# Patient Record
Sex: Male | Born: 1960
Health system: Southern US, Community
[De-identification: ages and names within clinical notes are randomized; demographics above are authoritative.]

## PROBLEM LIST (undated history)

## (undated) DIAGNOSIS — I2510 Atherosclerotic heart disease of native coronary artery without angina pectoris: Secondary | ICD-10-CM

## (undated) DIAGNOSIS — I10 Essential (primary) hypertension: Secondary | ICD-10-CM

## (undated) DIAGNOSIS — R7303 Prediabetes: Secondary | ICD-10-CM

## (undated) DIAGNOSIS — I509 Heart failure, unspecified: Secondary | ICD-10-CM

---

## 2000-04-07 ENCOUNTER — Emergency Department (HOSPITAL_COMMUNITY): Admission: EM | Admit: 2000-04-07 | Discharge: 2000-04-07 | Payer: Self-pay | Admitting: Emergency Medicine

## 2010-09-08 ENCOUNTER — Emergency Department (HOSPITAL_COMMUNITY)
Admission: EM | Admit: 2010-09-08 | Discharge: 2010-09-08 | Disposition: A | Discharge status: Home / Self Care | Payer: Self-pay | Attending: Emergency Medicine | Admitting: Emergency Medicine

## 2010-09-08 ENCOUNTER — Emergency Department (HOSPITAL_COMMUNITY): Payer: Self-pay

## 2010-09-08 DIAGNOSIS — Y92410 Unspecified street and highway as the place of occurrence of the external cause: Secondary | ICD-10-CM | POA: Insufficient documentation

## 2010-09-08 DIAGNOSIS — S139XXA Sprain of joints and ligaments of unspecified parts of neck, initial encounter: Secondary | ICD-10-CM | POA: Insufficient documentation

## 2010-09-08 DIAGNOSIS — M542 Cervicalgia: Secondary | ICD-10-CM | POA: Insufficient documentation

## 2019-01-25 ENCOUNTER — Other Ambulatory Visit: Payer: Self-pay

## 2019-01-25 DIAGNOSIS — Z20822 Contact with and (suspected) exposure to covid-19: Secondary | ICD-10-CM

## 2019-01-26 LAB — NOVEL CORONAVIRUS, NAA: SARS-CoV-2, NAA: NOT DETECTED

## 2020-07-24 ENCOUNTER — Encounter (HOSPITAL_COMMUNITY): Payer: Self-pay | Admitting: Emergency Medicine

## 2020-07-24 ENCOUNTER — Inpatient Hospital Stay (HOSPITAL_COMMUNITY)
Admission: EM | Admit: 2020-07-24 | Discharge: 2020-07-29 | DRG: 280 | Disposition: A | Discharge status: Home / Self Care | Payer: Self-pay | Attending: Internal Medicine | Admitting: Internal Medicine

## 2020-07-24 ENCOUNTER — Emergency Department (HOSPITAL_COMMUNITY): Payer: Self-pay

## 2020-07-24 DIAGNOSIS — N179 Acute kidney failure, unspecified: Secondary | ICD-10-CM | POA: Diagnosis present

## 2020-07-24 DIAGNOSIS — I161 Hypertensive emergency: Secondary | ICD-10-CM | POA: Diagnosis present

## 2020-07-24 DIAGNOSIS — I10 Essential (primary) hypertension: Secondary | ICD-10-CM

## 2020-07-24 DIAGNOSIS — R042 Hemoptysis: Secondary | ICD-10-CM | POA: Diagnosis present

## 2020-07-24 DIAGNOSIS — F17210 Nicotine dependence, cigarettes, uncomplicated: Secondary | ICD-10-CM | POA: Diagnosis present

## 2020-07-24 DIAGNOSIS — R0600 Dyspnea, unspecified: Secondary | ICD-10-CM

## 2020-07-24 DIAGNOSIS — I160 Hypertensive urgency: Secondary | ICD-10-CM | POA: Diagnosis present

## 2020-07-24 DIAGNOSIS — E785 Hyperlipidemia, unspecified: Secondary | ICD-10-CM | POA: Diagnosis present

## 2020-07-24 DIAGNOSIS — Z20822 Contact with and (suspected) exposure to covid-19: Secondary | ICD-10-CM | POA: Diagnosis present

## 2020-07-24 DIAGNOSIS — I5041 Acute combined systolic (congestive) and diastolic (congestive) heart failure: Secondary | ICD-10-CM

## 2020-07-24 DIAGNOSIS — I214 Non-ST elevation (NSTEMI) myocardial infarction: Principal | ICD-10-CM | POA: Diagnosis present

## 2020-07-24 DIAGNOSIS — I428 Other cardiomyopathies: Secondary | ICD-10-CM | POA: Diagnosis present

## 2020-07-24 DIAGNOSIS — R9431 Abnormal electrocardiogram [ECG] [EKG]: Secondary | ICD-10-CM

## 2020-07-24 DIAGNOSIS — I2510 Atherosclerotic heart disease of native coronary artery without angina pectoris: Secondary | ICD-10-CM | POA: Diagnosis present

## 2020-07-24 DIAGNOSIS — Z79899 Other long term (current) drug therapy: Secondary | ICD-10-CM

## 2020-07-24 DIAGNOSIS — N1831 Chronic kidney disease, stage 3a: Secondary | ICD-10-CM | POA: Diagnosis present

## 2020-07-24 DIAGNOSIS — I5043 Acute on chronic combined systolic (congestive) and diastolic (congestive) heart failure: Secondary | ICD-10-CM

## 2020-07-24 DIAGNOSIS — I5020 Unspecified systolic (congestive) heart failure: Secondary | ICD-10-CM

## 2020-07-24 DIAGNOSIS — R0601 Orthopnea: Secondary | ICD-10-CM

## 2020-07-24 DIAGNOSIS — I5021 Acute systolic (congestive) heart failure: Secondary | ICD-10-CM

## 2020-07-24 DIAGNOSIS — Z789 Other specified health status: Secondary | ICD-10-CM

## 2020-07-24 DIAGNOSIS — I5023 Acute on chronic systolic (congestive) heart failure: Secondary | ICD-10-CM | POA: Diagnosis present

## 2020-07-24 DIAGNOSIS — I130 Hypertensive heart and chronic kidney disease with heart failure and stage 1 through stage 4 chronic kidney disease, or unspecified chronic kidney disease: Secondary | ICD-10-CM | POA: Diagnosis present

## 2020-07-24 DIAGNOSIS — I472 Ventricular tachycardia: Secondary | ICD-10-CM | POA: Diagnosis present

## 2020-07-24 LAB — I-STAT VENOUS BLOOD GAS, ED
Acid-Base Excess: 0 mmol/L (ref 0.0–2.0)
Bicarbonate: 25.7 mmol/L (ref 20.0–28.0)
Calcium, Ion: 1.12 mmol/L — ABNORMAL LOW (ref 1.15–1.40)
HCT: 47 % (ref 39.0–52.0)
Hemoglobin: 16 g/dL (ref 13.0–17.0)
O2 Saturation: 93 %
Potassium: 3.6 mmol/L (ref 3.5–5.1)
Sodium: 143 mmol/L (ref 135–145)
TCO2: 27 mmol/L (ref 22–32)
pCO2, Ven: 42.1 mmHg — ABNORMAL LOW (ref 44.0–60.0)
pH, Ven: 7.393 (ref 7.250–7.430)
pO2, Ven: 68 mmHg — ABNORMAL HIGH (ref 32.0–45.0)

## 2020-07-24 LAB — POC SARS CORONAVIRUS 2 AG -  ED: SARS Coronavirus 2 Ag: NEGATIVE

## 2020-07-24 LAB — COMPREHENSIVE METABOLIC PANEL
ALT: 24 U/L (ref 0–44)
AST: 28 U/L (ref 15–41)
Albumin: 3.5 g/dL (ref 3.5–5.0)
Alkaline Phosphatase: 73 U/L (ref 38–126)
Anion gap: 10 (ref 5–15)
BUN: 22 mg/dL — ABNORMAL HIGH (ref 6–20)
CO2: 25 mmol/L (ref 22–32)
Calcium: 8.7 mg/dL — ABNORMAL LOW (ref 8.9–10.3)
Chloride: 106 mmol/L (ref 98–111)
Creatinine, Ser: 1.52 mg/dL — ABNORMAL HIGH (ref 0.61–1.24)
GFR, Estimated: 53 mL/min — ABNORMAL LOW (ref >60)
Glucose, Bld: 112 mg/dL — ABNORMAL HIGH (ref 70–99)
Potassium: 3.6 mmol/L (ref 3.5–5.1)
Sodium: 141 mmol/L (ref 135–145)
Total Bilirubin: 0.7 mg/dL (ref 0.3–1.2)
Total Protein: 6.9 g/dL (ref 6.5–8.1)

## 2020-07-24 LAB — CBC WITH DIFFERENTIAL/PLATELET
Abs Immature Granulocytes: 0.01 10*3/uL (ref 0.00–0.07)
Basophils Absolute: 0 10*3/uL (ref 0.0–0.1)
Basophils Relative: 0 %
Eosinophils Absolute: 0.1 10*3/uL (ref 0.0–0.5)
Eosinophils Relative: 1 %
HCT: 47.7 % (ref 39.0–52.0)
Hemoglobin: 15 g/dL (ref 13.0–17.0)
Immature Granulocytes: 0 %
Lymphocytes Relative: 26 %
Lymphs Abs: 1.7 10*3/uL (ref 0.7–4.0)
MCH: 24.8 pg — ABNORMAL LOW (ref 26.0–34.0)
MCHC: 31.4 g/dL (ref 30.0–36.0)
MCV: 78.8 fL — ABNORMAL LOW (ref 80.0–100.0)
Monocytes Absolute: 0.6 10*3/uL (ref 0.1–1.0)
Monocytes Relative: 9 %
Neutro Abs: 4.2 10*3/uL (ref 1.7–7.7)
Neutrophils Relative %: 64 %
Platelets: 236 10*3/uL (ref 150–400)
RBC: 6.05 MIL/uL — ABNORMAL HIGH (ref 4.22–5.81)
RDW: 16.8 % — ABNORMAL HIGH (ref 11.5–15.5)
WBC: 6.5 10*3/uL (ref 4.0–10.5)
nRBC: 0 % (ref 0.0–0.2)

## 2020-07-24 LAB — D-DIMER, QUANTITATIVE: D-Dimer, Quant: 3.32 ug/mL-FEU — ABNORMAL HIGH (ref 0.00–0.50)

## 2020-07-24 LAB — SARS CORONAVIRUS 2 BY RT PCR (HOSPITAL ORDER, PERFORMED IN ~~LOC~~ HOSPITAL LAB): SARS Coronavirus 2: NEGATIVE

## 2020-07-24 LAB — BRAIN NATRIURETIC PEPTIDE: B Natriuretic Peptide: 330.7 pg/mL — ABNORMAL HIGH (ref 0.0–100.0)

## 2020-07-24 LAB — TROPONIN I (HIGH SENSITIVITY)
Troponin I (High Sensitivity): 126 ng/L (ref <18)
Troponin I (High Sensitivity): 164 ng/L (ref <18)

## 2020-07-24 MED ORDER — HEPARIN (PORCINE) 25000 UT/250ML-% IV SOLN
1700.0000 [IU]/h | INTRAVENOUS | Status: DC
  Administered 2020-07-24: 1100 [IU]/h via INTRAVENOUS
  Administered 2020-07-26 – 2020-07-27 (×2): 1700 [IU]/h via INTRAVENOUS
  Filled 2020-07-24 (×5): qty 250

## 2020-07-24 MED ORDER — HEPARIN BOLUS VIA INFUSION
4000.0000 [IU] | Freq: Once | INTRAVENOUS | Status: AC
  Administered 2020-07-24: 4000 [IU] via INTRAVENOUS
  Filled 2020-07-24: qty 4000

## 2020-07-24 MED ORDER — IOHEXOL 350 MG/ML SOLN
80.0000 mL | Freq: Once | INTRAVENOUS | Status: AC | PRN
  Administered 2020-07-24: 80 mL via INTRAVENOUS

## 2020-07-24 MED ORDER — FUROSEMIDE 10 MG/ML IJ SOLN
40.0000 mg | Freq: Once | INTRAMUSCULAR | Status: AC
  Administered 2020-07-25: 40 mg via INTRAVENOUS
  Filled 2020-07-24: qty 4

## 2020-07-24 MED ORDER — LABETALOL HCL 5 MG/ML IV SOLN
5.0000 mg | Freq: Once | INTRAVENOUS | Status: AC
  Administered 2020-07-24: 5 mg via INTRAVENOUS
  Filled 2020-07-24: qty 4

## 2020-07-24 NOTE — H&P (Signed)
Cardiology Admission History and Physical:   Patient ID: Dustin Fowler MRN: [id number]; DOB: 03-03-1961   Admission date: 07/24/2020  Primary Care Provider: Pcp, No [facility] Cardiologist: No primary care provider on file.  [facility] Electrophysiologist:  None   Chief Complaint:  Shortness of breath  Patient Profile:   Dustin Fowler is a 60 y.o. male with no known PMH who presents with 6-12 months of worsening dyspnea on exertion.  History of Present Illness:   Dustin Fowler presents for evaluation of shortness of breath. He reports significant difficulty over the past 6 months - 1 year with shortness of breath particularly when riding his bike. He has had some sinus congestion as well and thought that his symptoms were all due to a cold. He has been trying to use over the counter cold medicines to treat his symptoms. He had to stop riding his bike to work about a month ago due to his shortness of breath and has been taking the bus instead. 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 His shortness of breath became so severe today he asked his boss to call 911. He has not had any chest pain today but does note having an episode of burning in his chest for about 15-20 minutes while riding his bike in September when his breathing started getting worse. He has not had chest pain since that time. He has been able to sleep on 2 pillows at home but notes occasionally waking up short of breath. He denies any lower leg swelling, lightheadedness, dizziness, palpitations, or nausea. He has not seen a doctor in years.   History reviewed. No pertinent past medical history.  History reviewed. No pertinent surgical history.   Medications Prior to Admission: None  Allergies:   No Known Allergies  Social History:   Lives alone. Works in a Electronics engineer. Smokes 1/2 ppd since age 53. Drinks ~3 beers a day. Denies any  drug use.   Family History:  2 sisters died of sickle cell disease Parents medical history unknown Daughter born with a heart murmur  ROS:  Please see the history of present illness.  All other ROS reviewed and negative.     Physical Exam/Data:   Vitals:   07/24/20 2015 07/24/20 2100 07/24/20 2115 07/24/20 2214  BP: (!) 198/94 (!) 173/108  (!) 169/111  Pulse: (!) 101 96 (!) 101 93  Resp: 15 17 19 20   Temp:    98.9 F (37.2 C)  TempSrc:    Oral  SpO2: 100% 100% 100% 100%  Weight:      Height:       No intake or output data in the 24 hours ending 07/24/20 2351 Last 3 Weights 07/24/2020  Weight (lbs) 230 lb 6.1 oz  Weight (kg) 104.5 kg     Body mass index is 34.02 kg/m.  General:  Well nourished, well developed, diaphoretic HEENT: normal Neck: JVP elevated to the mandible when sitting upright at 90 degrees Cardiac:  Tachycardic, regular rhythm. 1/6 systolic heart murmur heard best at the LLSB Lungs:  clear to auscultation bilaterally, no wheezing, rhonchi or rales  Abd: soft, nontender, no hepatomegaly  Ext: 1+ pitting edema bilaterally  Musculoskeletal:  No deformities, BUE and BLE strength normal and equal Skin: warm and dry  Neuro:  CNs 2-12 intact, no focal abnormalities noted Psych:  Normal affect    EKG:  The ECG that was  done was personally reviewed and demonstrates sinus tachycardia with atrial enlargement and LVH with repolarization abnormalities  Relevant CV Studies: None   Laboratory Data:  High Sensitivity Troponin:   Recent Labs  Lab 07/24/20 1514 07/24/20 1648  TROPONINIHS 126* 164*      Chemistry Recent Labs  Lab 07/24/20 1324 07/24/20 1339  NA 141 143  K 3.6 3.6  CL 106  --   CO2 25  --   GLUCOSE 112*  --   BUN 22*  --   CREATININE 1.52*  --   CALCIUM 8.7*  --   GFRNONAA 53*  --   ANIONGAP 10  --     Recent Labs  Lab 07/24/20 1324  PROT 6.9  ALBUMIN 3.5  AST 28  ALT 24  ALKPHOS 73  BILITOT 0.7   Hematology Recent Labs   Lab 07/24/20 1324 07/24/20 1339  WBC 6.5  --   RBC 6.05*  --   HGB 15.0 16.0  HCT 47.7 47.0  MCV 78.8*  --   MCH 24.8*  --   MCHC 31.4  --   RDW 16.8*  --   PLT 236  --    BNP Recent Labs  Lab 07/24/20 1325  BNP 330.7*    DDimer  Recent Labs  Lab 07/24/20 1528  DDIMER 3.32*     Radiology/Studies:  CT Angio Chest PE W and/or Wo Contrast  Result Date: 07/24/2020 CLINICAL DATA:  60 year old male with concern for pulmonary embolism. EXAM: CT ANGIOGRAPHY CHEST WITH CONTRAST TECHNIQUE: Multidetector CT imaging of the chest was performed using the standard protocol during bolus administration of intravenous contrast. Multiplanar CT image reconstructions and MIPs were obtained to evaluate the vascular anatomy. CONTRAST:  55mL OMNIPAQUE IOHEXOL 350 MG/ML SOLN COMPARISON:  Chest radiograph dated 07/24/2020. FINDINGS: Cardiovascular: There is moderate cardiomegaly. No pericardial effusion. Retrograde flow of contrast from the right atrium into the IVC suggestive of a degree of right heart dysfunction. Mild atherosclerotic calcification of the thoracic aorta. No aneurysmal dilatation. No CT evidence of pulmonary embolism. Mediastinum/Nodes: Mildly enlarged right hilar lymph node measures 16 mm in short axis. Mildly enlarged lymph nodes the right of the upper esophagus measure up to 11 mm short axis. The esophagus and the thyroid gland are grossly unremarkable. No mediastinal fluid collection. Lungs/Pleura: There is diffuse interstitial and interlobular septal prominence consistent with edema. Bilateral lower lobe diffuse ground-glass density as well as a cluster of ground-glass opacity in the right suprahilar region may represent edema. Atypical pneumonia is not excluded clinical correlation recommended. There is no pleural effusion pneumothorax. The central airways are patent. Upper Abdomen: No acute abnormality. Musculoskeletal: No chest wall abnormality. No acute or significant osseous  findings. Review of the MIP images confirms the above findings. IMPRESSION: 1. No CT evidence of pulmonary embolism. 2. Moderate cardiomegaly with findings of right heart dysfunction. 3. Bilateral lower lobe diffuse ground-glass density as well as a cluster of ground-glass opacity in the right suprahilar region likely represent edema. Atypical pneumonia is not excluded clinical correlation recommended. 4. Mildly enlarged right hilar and mediastinal lymph nodes, likely reactive. 5. Aortic Atherosclerosis (ICD10-I70.0). Electronically Signed   By: Elgie Collard M.D.   On: 07/24/2020 18:14   DG Chest Port 1 View  Result Date: 07/24/2020 CLINICAL DATA:  Dyspnea, hypoxia EXAM: PORTABLE CHEST 1 VIEW COMPARISON:  None. FINDINGS: The lungs are symmetrically well expanded. No pneumothorax or pleural effusion. There is mild bilateral perihilar and lower lung zone interstitial infiltrate possibly representing  changes of mild interstitial pulmonary edema. There is mild to moderate cardiomegaly. No acute bone abnormality. IMPRESSION: Mild to moderate cardiomegaly. Mild interstitial pulmonary edema in keeping with mild cardiogenic failure. Electronically Signed   By: Helyn Numbers MD   On: 07/24/2020 13:38     Assessment and Plan:   1. Concern for new systolic heart failure- He presents with significant dyspnea on exertion that has worsened over the past few months with evidence of volume overload on exam. His CT chest also demonstrated pulmonary edema and cardiomegaly consistent with CHF. High suspicion for ischemic cardiomyopathy in the setting of dyspnea on exertion and episode of prior chest pain. He has not seen a doctor. Will evaluate for underlying risk factors for coronary disease including HLD and DM. He is a smoker. He will likely need a LHC to evaluate for ischemic disease once closer to euvolemia.  - Check lipid panel, TSH and HgA1c - Start heparin drip for ACS given rise in troponins - S/p aspirin  324mg   - Start aspirin 81mg  daily  - Start lipitor 80mg  - Start metoprolol 12.5mg  BID  - Will need significant uptitration of beta blocker and initiation of other GDMT pending improvement in renal function - Echo ordered - Will benefit from LHC this admission to evaluate for ischemic disease  - Lasix 40mg  IV  - Daily weights - Will re-dose diuretics in the morning    Risk Assessment/Risk Scores:    New York Heart Association (NYHA) Functional Class NYHA Class III   Severity of Illness: The appropriate patient status for this patient is INPATIENT. Inpatient status is judged to be reasonable and necessary in order to provide the required intensity of service to ensure the patient's safety. The patient's presenting symptoms, physical exam findings, and initial radiographic and laboratory data in the context of their chronic comorbidities is felt to place them at high risk for further clinical deterioration. Furthermore, it is not anticipated that the patient will be medically stable for discharge from the hospital within 2 midnights of admission. The following factors support the patient status of inpatient.   " The patient's presenting symptoms include shortness of breath. " The worrisome physical exam findings include volume overload. " The initial radiographic and laboratory data are worrisome because of elevated troponin and BNP. " The chronic co-morbidities include none.   * I certify that at the point of admission it is my clinical judgment that the patient will require inpatient hospital care spanning beyond 2 midnights from the point of admission due to high intensity of service, high risk for further deterioration and high frequency of surveillance required.*    For questions or updates, please contact [facility] Please consult [email or web address] for contact info under     Signed, , MD  07/24/2020 11:51 PM

## 2020-07-24 NOTE — Progress Notes (Signed)
ANTICOAGULATION CONSULT NOTE - Initial Consult  Pharmacy Consult for heparin Indication: chest pain/ACS  No Known Allergies  Patient Measurements: Height: 5\' 9"  (175.3 cm) Weight: 104.5 kg (230 lb 6.1 oz) IBW/kg (Calculated) : 70.7 Heparin Dosing Weight: 93.2kg  Vital Signs: Temp: 98.9 F (37.2 C) (02/04 2214) Temp Source: Oral (02/04 2214) BP: 169/111 (02/04 2214) Pulse Rate: 93 (02/04 2214)  Labs: Recent Labs    07/24/20 1324 07/24/20 1339 07/24/20 1514 07/24/20 1648  HGB 15.0 16.0  --   --   HCT 47.7 47.0  --   --   PLT 236  --   --   --   CREATININE 1.52*  --   --   --   TROPONINIHS  --   --  126* 164*    Estimated Creatinine Clearance: 63.9 mL/min (A) (by C-G formula based on SCr of 1.52 mg/dL (H)).   Medical History: History reviewed. No pertinent past medical history.  Assessment: 77 YOM presenting with UR sx and SOB, troponin elevated.  Not on anticoagulation PTA,  CBC wnl.    Goal of Therapy:  Heparin level 0.3-0.7 units/ml Monitor platelets by anticoagulation protocol: Yes   Plan:  Heparin 4000 units IV x 1, and gtt at 1100 units/hr F/u 6 hour heparin level with AM labs F/u cards eval and recs  60, PharmD Clinical Pharmacist ED Pharmacist Phone # [phone] 07/24/2020 10:27 PM

## 2020-07-24 NOTE — ED Notes (Signed)
Margarette Asal, PA is aware of pt's bp and stated she will put in orders.

## 2020-07-24 NOTE — ED Provider Notes (Signed)
3:17 PM Care assumed from [place], [place].  At time of transfer of care, patient is awaiting further work-up prior to disposition determination.  Patient has had some chronic shortness of breath but is acutely worsened in the last week including some hemoptysis and orthopnea.  The symptoms were exertional.  Patient is awaiting results of D-dimer which is positive would lead PE study for his tachycardia, age, and emesis.  Patient is also waiting for troponin and will also be given some medication for concern for hypertensive emergency with a blood pressure of 225/131.  Patient's oxygen saturations were around 90 on arrival and is put on oxygen however he was weaned from initially at rest.  We are most concerned about new heart failure in the setting of hypertensive emergency leading to some pulmonary edema.  Due to the lumps, we will rule out PE with a D-dimer initially.  He is Covid negative.  X-ray showed pulmonary edema.  Given the patient's acutely worsening symptoms and the borderline hypoxia and the uncontrolled blood pressure, anticipate he will require admission for blood pressure management, echo, and diuresis after work-up is completed.  D-dimer was elevated and PE study was ordered.  PE study did not show clot but did show evidence of pulmonary edema.  Troponin was rising.  Cardiology was called who will see and admit patient for new heart failure.  Clinical Impression: 1. Elevated blood pressure reading with diagnosis of hypertension   2. Daily consumption of alcohol   3. T wave inversion in EKG     Disposition: Admit  This note was prepared with assistance of Dragon voice recognition software. Occasional wrong-word or sound-a-like substitutions may have occurred due to the inherent limitations of voice recognition software.     Gilman Olazabal, Canary Brim, MD 07/24/20 [phone]

## 2020-07-24 NOTE — ED Notes (Signed)
Verbally notified Dr Rush Landmark of pt's critical troponin value.

## 2020-07-24 NOTE — ED Notes (Signed)
Pt's walking Sa02 was 96% at the lowest.

## 2020-07-24 NOTE — ED Provider Notes (Signed)
[facility] EMERGENCY DEPARTMENT Provider Note   CSN: [id number] Arrival date & time: 07/24/20  1301    History Chief Complaint  Patient presents with  . Hypertension    Dustin Fowler is a 60 y.o. male with history of tobacco use, untreated HTN presents to the ED for evaluation of "chest congestion" and "sinus congestion".  Onset 6 to 12 months ago.  Has had productive cough of clear phlegm that sometimes has bright red streaks of blood in it.  Reports associated shortness of breath with exertion, orthopnea.  He is now having to use 2 pillows under his head when he lays down because he otherwise feels very short of breath.  Denies PND, leg swelling, leg pain, chest pain.  No lightheadedness or passing out.  No known cardiac problems however patient does not have a primary care doctor and has not been seen by a medical provider in a very long time.  He was at one point told he had high blood pressure but is not taking any medicines for this.  At first thought that his symptoms were from Covid but he has tested negative several times.  Also, he thought that his congestion was from riding his motorcycle and being outside.  No fever, sore throat, vomiting, abdominal pain, diarrhea.  No history of blood clots, hormone therapy, recent surgery, prolonged immobilization. Drinks one 40 oz beer and sometimes liquor every day. Denies previous issues with withdrawals.   HPI     History reviewed. No pertinent past medical history.  There are no problems to display for this patient.   History reviewed. No pertinent surgical history.     No family history on file.  Social History   Tobacco Use  . Smoking status: Current Some Day Smoker  . Smokeless tobacco: Never Used  Substance Use Topics  . Alcohol use: Not Currently  . Drug use: Not Currently    Home Medications Prior to Admission medications   Not on File    Allergies    Patient has no known  allergies.  Review of Systems   Review of Systems  Respiratory: Positive for cough and shortness of breath.   All other systems reviewed and are negative.   Physical Exam Updated Vital Signs BP (!) 225/131   Pulse (!) 114   Temp 98.9 F (37.2 C) (Oral)   Resp (!) 25   Ht 5\' 9"  (1.753 m)   Wt 104.5 kg   SpO2 98%   BMI 34.02 kg/m   Physical Exam Vitals and nursing note reviewed.  Constitutional:      General: He is not in acute distress.    Appearance: He is well-developed and well-nourished.     Comments: NAD.  HENT:     Head: Normocephalic and atraumatic.     Right Ear: External ear normal.     Left Ear: External ear normal.     Nose: Nose normal.  Eyes:     General: No scleral icterus.    Extraocular Movements: EOM normal.     Conjunctiva/sclera: Conjunctivae normal.  Cardiovascular:     Rate and Rhythm: Normal rate and regular rhythm.     Pulses: Intact distal pulses.     Heart sounds: Normal heart sounds. No murmur heard.     Comments: No lower extremity edema.  No calf tenderness. Pulmonary:     Effort: Pulmonary effort is normal.     Breath sounds: Rales present.     Comments: Speaking  in full sentences.  SPO2 greater than 95% on room air.  Subtle crackles in lower lobes bilaterally.  No wheezing. Musculoskeletal:        General: No deformity. Normal range of motion.     Cervical back: Normal range of motion and neck supple.  Skin:    General: Skin is warm and dry.     Capillary Refill: Capillary refill takes less than 2 seconds.  Neurological:     Mental Status: He is alert and oriented to person, place, and time.  Psychiatric:        Mood and Affect: Mood and affect normal.        Behavior: Behavior normal.        Thought Content: Thought content normal.        Judgment: Judgment normal.     ED Results / Procedures / Treatments   Labs (all labs ordered are listed, but only abnormal results are displayed) Labs Reviewed  CBC WITH  DIFFERENTIAL/PLATELET - Abnormal; Notable for the following components:      Result Value   RBC 6.05 (*)    MCV 78.8 (*)    MCH 24.8 (*)    RDW 16.8 (*)    All other components within normal limits  COMPREHENSIVE METABOLIC PANEL - Abnormal; Notable for the following components:   Glucose, Bld 112 (*)    BUN 22 (*)    Creatinine, Ser 1.52 (*)    Calcium 8.7 (*)    GFR, Estimated 53 (*)    All other components within normal limits  BRAIN NATRIURETIC PEPTIDE - Abnormal; Notable for the following components:   B Natriuretic Peptide 330.7 (*)    All other components within normal limits  I-STAT VENOUS BLOOD GAS, ED - Abnormal; Notable for the following components:   pCO2, Ven 42.1 (*)    pO2, Ven 68.0 (*)    Calcium, Ion 1.12 (*)    All other components within normal limits  SARS CORONAVIRUS 2 BY RT PCR (HOSPITAL ORDER, PERFORMED IN [place] [facility])  D-DIMER, QUANTITATIVE (NOT AT [facility])  POC SARS CORONAVIRUS 2 AG -  ED  TROPONIN I (HIGH SENSITIVITY)  TROPONIN I (HIGH SENSITIVITY)    EKG EKG Interpretation  Date/Time:  Friday July 24 2020 13:07:31 EST Ventricular Rate:  111 PR Interval:  170 QRS Duration: 128 QT Interval:  352 QTC Calculation: 478 R Axis:   144 Text Interpretation: Sinus tachycardia with Premature atrial complexes Possible Left atrial enlargement Left ventricular hypertrophy with QRS widening ( Cornell product ) T wave abnormality, consider inferolateral ischemia Abnormal ECG Confirmed by Kristine Royal [phone]) on 07/24/2020 2:09:41 PM   Radiology DG Chest Port 1 View  Result Date: 07/24/2020 CLINICAL DATA:  Dyspnea, hypoxia EXAM: PORTABLE CHEST 1 VIEW COMPARISON:  None. FINDINGS: The lungs are symmetrically well expanded. No pneumothorax or pleural effusion. There is mild bilateral perihilar and lower lung zone interstitial infiltrate possibly representing changes of mild interstitial pulmonary edema. There is mild to moderate cardiomegaly. No acute  bone abnormality. IMPRESSION: Mild to moderate cardiomegaly. Mild interstitial pulmonary edema in keeping with mild cardiogenic failure. Electronically Signed   By: Helyn Numbers MD   On: 07/24/2020 13:38    Procedures Procedures   Medications Ordered in ED Medications  labetalol (NORMODYNE) injection 5 mg (5 mg Intravenous Given 07/24/20 1525)    ED Course  I have reviewed the triage vital signs and the nursing notes.  Pertinent labs & imaging results that were available  during my care of the patient were reviewed by me and considered in my medical decision making (see chart for details).  Clinical Course as of 07/24/20 1559  Fri Jul 24, 2020  1407 DG Chest [place] 1 View IMPRESSION: Mild to moderate cardiomegaly. Mild interstitial pulmonary edema in keeping with mild cardiogenic failure. [CG]  1408 Creatinine(!): 1.52 [CG]  1548 Creatinine(!): 1.52 [CG]  1548 B Natriuretic Peptide(!): 330.7 [CG]  1548 WBC: 6.5 [CG]  1548 SARS Coronavirus 2: NEGATIVE [CG]  1548 pH, Ven: 7.393 [CG]  1548 SARS Coronavirus 2 Ag: NEGATIVE [CG]  1548 BP(!): 231/147 [CG]  1548 Pulse Rate(!): 114 [CG]  1549 EKG 12-Lead Sinus tachycardia HR 111 LVH TWI in inferior leads [CG]    Clinical Course User Index [CG] Liberty Handy, PA-C   MDM Rules/Calculators/A&P                           60 y.o. yo with chief complaint of presents to the ED for evaluation of exertional shortness of breath, productive cough with clear and bloody sputum, orthopnea for the last 6 to 12 months.  Very hypertensive in triage 231/147.   Previous medical records available, triage and nursing notes reviewed to obtain more history and assist with MDM.  No previous medical records available.   Chief complain involves an extensive number of treatment options and is a complaint that carries with it a high risk of complications and morbidity and mortality.    Differential diagnosis: new CHF vs ACS. PE considered but less likely  because of duration of symptoms. Viral illness?  ER lab work and imaging ordered by triage RN and me, as above  I have personally visualized and interpreted ER diagnostic work up including labs and imaging.    Labs reveal - normal WBC, hemoglobin. Creatinine 1.52. BNP 330. VBG essentially normal. COVID negative.    Imaging reveals - CXR with mild pulmonary edema, mild/moderate cardiomegaly.     Medications ordered - labetalol for BP control.   Ordered continuous cardiac and pulse ox monitoring.  Will plan for serial re-examinations. Close monitoring.   1550: Re-evaluated the patient.  BP is better SBP 175.  Updated on pending labs, possible admission.  Patient care transferred to EDP T[place] pending d-dimer, trop. Patient denies CP.   Final Clinical Impression(s) / ED Diagnoses Final diagnoses:  Elevated blood pressure reading with diagnosis of hypertension  Daily consumption of alcohol  T wave inversion in EKG    Rx / DC Orders ED Discharge Orders    None       Liberty Handy, PA-C 07/24/20 1559    [place], Canary Brim, MD 07/24/20 [phone]

## 2020-07-24 NOTE — ED Triage Notes (Signed)
Pt transported from work for increased work of breathing, hemoptysis, cough, extreme HTN, orthopnea, no hx of htn, rhonchi. No recent covid test, +vaccination.

## 2020-07-24 NOTE — ED Provider Notes (Signed)
1315: Autumn RN notified me of patient's triage symptoms including Sob, orthopnea, HTN, hemoptysis, cough. SpO2 90% on RA, on 4 L now. Patient in waiting room.  She has notified Rande Brunt patient should be prioritized to be placed in a room for cardiac/Spo2 monitoring given hypoxia, HTN 210/112, orthopnea. Labs ordered. Concern for CHF, PE, COVID.     Liberty Handy, PA-C 07/24/20 1317    Wynetta Fines, MD 07/27/20 [phone]

## 2020-07-25 ENCOUNTER — Inpatient Hospital Stay (HOSPITAL_COMMUNITY): Payer: Self-pay

## 2020-07-25 DIAGNOSIS — R0600 Dyspnea, unspecified: Secondary | ICD-10-CM

## 2020-07-25 DIAGNOSIS — I160 Hypertensive urgency: Secondary | ICD-10-CM

## 2020-07-25 DIAGNOSIS — R0601 Orthopnea: Secondary | ICD-10-CM | POA: Insufficient documentation

## 2020-07-25 DIAGNOSIS — R0609 Other forms of dyspnea: Secondary | ICD-10-CM | POA: Insufficient documentation

## 2020-07-25 DIAGNOSIS — I5023 Acute on chronic systolic (congestive) heart failure: Secondary | ICD-10-CM

## 2020-07-25 LAB — CBC
HCT: 48.3 % (ref 39.0–52.0)
Hemoglobin: 14.7 g/dL (ref 13.0–17.0)
MCH: 23.9 pg — ABNORMAL LOW (ref 26.0–34.0)
MCHC: 30.4 g/dL (ref 30.0–36.0)
MCV: 78.5 fL — ABNORMAL LOW (ref 80.0–100.0)
Platelets: 235 10*3/uL (ref 150–400)
RBC: 6.15 MIL/uL — ABNORMAL HIGH (ref 4.22–5.81)
RDW: 16.2 % — ABNORMAL HIGH (ref 11.5–15.5)
WBC: 6.5 10*3/uL (ref 4.0–10.5)
nRBC: 0 % (ref 0.0–0.2)

## 2020-07-25 LAB — LIPID PANEL
Cholesterol: 260 mg/dL — ABNORMAL HIGH (ref 0–200)
HDL: 80 mg/dL (ref >40)
LDL Cholesterol: 169 mg/dL — ABNORMAL HIGH (ref 0–99)
Total CHOL/HDL Ratio: 3.3 RATIO
Triglycerides: 53 mg/dL (ref <150)
VLDL: 11 mg/dL (ref 0–40)

## 2020-07-25 LAB — BASIC METABOLIC PANEL
Anion gap: 12 (ref 5–15)
BUN: 17 mg/dL (ref 6–20)
CO2: 24 mmol/L (ref 22–32)
Calcium: 9.2 mg/dL (ref 8.9–10.3)
Chloride: 105 mmol/L (ref 98–111)
Creatinine, Ser: 1.44 mg/dL — ABNORMAL HIGH (ref 0.61–1.24)
GFR, Estimated: 57 mL/min — ABNORMAL LOW (ref >60)
Glucose, Bld: 110 mg/dL — ABNORMAL HIGH (ref 70–99)
Potassium: 4.1 mmol/L (ref 3.5–5.1)
Sodium: 141 mmol/L (ref 135–145)

## 2020-07-25 LAB — HEPARIN LEVEL (UNFRACTIONATED)
Heparin Unfractionated: <0.1 IU/mL — ABNORMAL LOW (ref 0.30–0.70)
Heparin Unfractionated: 0.25 IU/mL — ABNORMAL LOW (ref 0.30–0.70)
Heparin Unfractionated: 0.32 IU/mL (ref 0.30–0.70)

## 2020-07-25 LAB — ECHOCARDIOGRAM COMPLETE
Area-P 1/2: 4.49 cm2
Height: 69 in
S' Lateral: 4.1 cm
Weight: 3686.09 oz

## 2020-07-25 LAB — HEMOGLOBIN A1C
Hgb A1c MFr Bld: 5.9 % — ABNORMAL HIGH (ref 4.8–5.6)
Mean Plasma Glucose: 122.63 mg/dL

## 2020-07-25 LAB — TSH: TSH: 1.607 u[IU]/mL (ref 0.350–4.500)

## 2020-07-25 LAB — HIV ANTIBODY (ROUTINE TESTING W REFLEX): HIV Screen 4th Generation wRfx: NONREACTIVE

## 2020-07-25 MED ORDER — NITROGLYCERIN 0.4 MG SL SUBL: 0.4000 mg | SUBLINGUAL_TABLET | SUBLINGUAL | Status: DC | PRN

## 2020-07-25 MED ORDER — ATORVASTATIN CALCIUM 80 MG PO TABS
80.0000 mg | ORAL_TABLET | Freq: Every day | ORAL | Status: DC
  Administered 2020-07-25 – 2020-07-29 (×5): 80 mg via ORAL
  Filled 2020-07-25 (×4): qty 1
  Filled 2020-07-25: qty 2

## 2020-07-25 MED ORDER — ONDANSETRON HCL 4 MG/2ML IJ SOLN: 4.0000 mg | Freq: Four times a day (QID) | INTRAMUSCULAR | Status: DC | PRN

## 2020-07-25 MED ORDER — ASPIRIN 81 MG PO CHEW
324.0000 mg | CHEWABLE_TABLET | ORAL | Status: AC
  Administered 2020-07-25: 324 mg via ORAL
  Filled 2020-07-25: qty 4

## 2020-07-25 MED ORDER — ASPIRIN 300 MG RE SUPP: 300.0000 mg | RECTAL | Status: AC

## 2020-07-25 MED ORDER — HYDRALAZINE HCL 25 MG PO TABS
25.0000 mg | ORAL_TABLET | Freq: Three times a day (TID) | ORAL | Status: DC
  Administered 2020-07-25 – 2020-07-26 (×4): 25 mg via ORAL
  Filled 2020-07-25 (×4): qty 1

## 2020-07-25 MED ORDER — ASPIRIN EC 81 MG PO TBEC
81.0000 mg | DELAYED_RELEASE_TABLET | Freq: Every day | ORAL | Status: DC
  Administered 2020-07-25 – 2020-07-26 (×2): 81 mg via ORAL
  Filled 2020-07-25 (×2): qty 1

## 2020-07-25 MED ORDER — HEPARIN BOLUS VIA INFUSION
3000.0000 [IU] | Freq: Once | INTRAVENOUS | Status: AC
  Administered 2020-07-25: 3000 [IU] via INTRAVENOUS
  Filled 2020-07-25: qty 3000

## 2020-07-25 MED ORDER — ACETAMINOPHEN 325 MG PO TABS
650.0000 mg | ORAL_TABLET | ORAL | Status: DC | PRN
  Administered 2020-07-26 – 2020-07-28 (×4): 650 mg via ORAL
  Filled 2020-07-25 (×5): qty 2

## 2020-07-25 MED ORDER — METOPROLOL TARTRATE 12.5 MG HALF TABLET
12.5000 mg | ORAL_TABLET | Freq: Two times a day (BID) | ORAL | Status: DC
  Administered 2020-07-25 – 2020-07-27 (×5): 12.5 mg via ORAL
  Filled 2020-07-25 (×5): qty 1

## 2020-07-25 MED ORDER — FUROSEMIDE 10 MG/ML IJ SOLN
40.0000 mg | Freq: Once | INTRAMUSCULAR | Status: AC
  Administered 2020-07-25: 40 mg via INTRAVENOUS
  Filled 2020-07-25: qty 4

## 2020-07-25 MED ORDER — PERFLUTREN LIPID MICROSPHERE
1.0000 mL | INTRAVENOUS | Status: AC | PRN
  Administered 2020-07-25: 2 mL via INTRAVENOUS
  Filled 2020-07-25: qty 10

## 2020-07-25 NOTE — Progress Notes (Signed)
  Echocardiogram 2D Echocardiogram has been performed.  Octavio Matheney G Albirta Rhinehart 07/25/2020, 11:21 AM

## 2020-07-25 NOTE — Progress Notes (Signed)
ANTICOAGULATION CONSULT NOTE  Pharmacy Consult for heparin Indication: chest pain/ACS  No Known Allergies  Patient Measurements: Height: 5\' 9"  (175.3 cm) Weight: 104.5 kg (230 lb 6.1 oz) IBW/kg (Calculated) : 70.7 Heparin Dosing Weight: 93.2kg  Vital Signs: Temp: 98.9 F (37.2 C) (02/04 2214) Temp Source: Oral (02/04 2214) BP: 168/111 (02/05 0215) Pulse Rate: 87 (02/05 0245)  Labs: Recent Labs    07/24/20 1324 07/24/20 1339 07/24/20 1514 07/24/20 1648 07/25/20 0352  HGB 15.0 16.0  --   --  14.7  HCT 47.7 47.0  --   --  48.3  PLT 236  --   --   --  235  HEPARINUNFRC  --   --   --   --  <0.10*  CREATININE 1.52*  --   --   --   --   TROPONINIHS  --   --  126* 164*  --     Estimated Creatinine Clearance: 63.9 mL/min (A) (by C-G formula based on SCr of 1.52 mg/dL (H)).  Assessment: 70 YOM presenting with UR sx and SOB, troponin elevated.  Not on anticoagulation PTA,  CBC wnl.    Heparin level undetectable on gtt at 1100 units/hr. No issues with line or bleeding reported per RN.  Goal of Therapy:  Heparin level 0.3-0.7 units/ml Monitor platelets by anticoagulation protocol: Yes   Plan:  Rebolus heparin 3000 units and increase gtt to 1500 units/hr F/u 6 hour heparin level   58, PharmD, BCPS Please see amion for complete clinical pharmacist phone list 07/25/2020 5:53 AM

## 2020-07-25 NOTE — ED Notes (Signed)
[facility] is transporting pt upstairs at this time.

## 2020-07-25 NOTE — Progress Notes (Signed)
ANTICOAGULATION CONSULT NOTE  Pharmacy Consult for heparin Indication: chest pain/ACS  No Known Allergies  Patient Measurements: Height: 5\' 9"  (175.3 cm) Weight: 104.5 kg (230 lb 6.1 oz) IBW/kg (Calculated) : 70.7 Heparin Dosing Weight: 93.2kg  Vital Signs: BP: 154/111 (02/05 1215) Pulse Rate: 86 (02/05 1215)  Labs: Recent Labs    07/24/20 1324 07/24/20 1339 07/24/20 1514 07/24/20 1648 07/25/20 0352 07/25/20 0809 07/25/20 1216  HGB 15.0 16.0  --   --  14.7  --   --   HCT 47.7 47.0  --   --  48.3  --   --   PLT 236  --   --   --  235  --   --   HEPARINUNFRC  --   --   --   --  <0.10*  --  0.25*  CREATININE 1.52*  --   --   --   --  1.44*  --   TROPONINIHS  --   --  126* 164*  --   --   --     Estimated Creatinine Clearance: 67.4 mL/min (A) (by C-G formula based on SCr of 1.44 mg/dL (H)).  Assessment: 47 YOM presenting with UR sx and SOB, troponin elevated.  Not on anticoagulation PTA,  CBC wnl.    Heparin level subtherapeutic but uptrend to 0.25 s/p rate increase to 1500 units/hr  Goal of Therapy:  Heparin level 0.3-0.7 units/ml Monitor platelets by anticoagulation protocol: Yes   Plan:  Increase heparin gtt to 1700 units/hr F/u 6 hour heparin level F/u cards plan  60, PharmD Clinical Pharmacist ED Pharmacist Phone # [phone] 07/25/2020 1:12 PM

## 2020-07-25 NOTE — ED Notes (Signed)
Walked patient to the bathroom patient did well 

## 2020-07-25 NOTE — Progress Notes (Signed)
Progress Note  Patient Name: Dustin Fowler Date of Encounter: 07/25/2020  Primary Cardiologist: No primary care provider on file.   Subjective   SOB much improved. Not on supp 02. Sitting up on side of bed comfortably. Denies having any chest pain or pressure, now or prior to admission.  Inpatient Medications    Scheduled Meds: . aspirin EC  81 mg Oral Daily  . atorvastatin  80 mg Oral Daily  . metoprolol tartrate  12.5 mg Oral BID   Continuous Infusions: . heparin 1,500 Units/hr (07/25/20 0648)   PRN Meds:    Vital Signs    Vitals:   07/25/20 0515 07/25/20 0600 07/25/20 0625 07/25/20 0645  BP: (!) 165/91 (!) 153/118 (!) 158/131 (!) 157/118  Pulse: 95 90 86 86  Resp: 16 (!) 21 17 20   Temp:      TempSrc:      SpO2: 95% 98% 99% 94%  Weight:      Height:       No intake or output data in the 24 hours ending 07/25/20 0754 Filed Weights   07/24/20 1317  Weight: 104.5 kg    Telemetry    Sinus tachycardia, PVCs, NSVT 3-4 beat runs - Personally Reviewed  ECG    Sinus tachycardia, LVH, T wave abnl may be repolarization change vs indicative of ischemia  - Personally Reviewed  Physical Exam   GEN: No acute distress.   Neck: No JVD sitting upright Cardiac: regular rhythm, normal rate, no murmurs, rubs, or gallops.  Respiratory: crackles in bases GI: Soft, nontender, non-distended  MS: trace ankle edema; No deformity. Neuro:  Nonfocal  Psych: Normal affect   Labs    Chemistry Recent Labs  Lab 07/24/20 1324 07/24/20 1339  NA 141 143  K 3.6 3.6  CL 106  --   CO2 25  --   GLUCOSE 112*  --   BUN 22*  --   CREATININE 1.52*  --   CALCIUM 8.7*  --   PROT 6.9  --   ALBUMIN 3.5  --   AST 28  --   ALT 24  --   ALKPHOS 73  --   BILITOT 0.7  --   GFRNONAA 53*  --   ANIONGAP 10  --      Hematology Recent Labs  Lab 07/24/20 1324 07/24/20 1339 07/25/20 0352  WBC 6.5  --  6.5  RBC 6.05*  --  6.15*  HGB 15.0 16.0 14.7  HCT 47.7 47.0 48.3  MCV  78.8*  --  78.5*  MCH 24.8*  --  23.9*  MCHC 31.4  --  30.4  RDW 16.8*  --  16.2*  PLT 236  --  235    Cardiac EnzymesNo results for input(s): TROPONINI in the last 168 hours. No results for input(s): TROPIPOC in the last 168 hours.   BNP Recent Labs  Lab 07/24/20 1325  BNP 330.7*     DDimer  Recent Labs  Lab 07/24/20 1528  DDIMER 3.32*     Radiology    CT Angio Chest PE W and/or Wo Contrast  Result Date: 07/24/2020 CLINICAL DATA:  60 year old male with concern for pulmonary embolism. EXAM: CT ANGIOGRAPHY CHEST WITH CONTRAST TECHNIQUE: Multidetector CT imaging of the chest was performed using the standard protocol during bolus administration of intravenous contrast. Multiplanar CT image reconstructions and MIPs were obtained to evaluate the vascular anatomy. CONTRAST:  15mL OMNIPAQUE IOHEXOL 350 MG/ML SOLN COMPARISON:  Chest radiograph dated 07/24/2020.  FINDINGS: Cardiovascular: There is moderate cardiomegaly. No pericardial effusion. Retrograde flow of contrast from the right atrium into the IVC suggestive of a degree of right heart dysfunction. Mild atherosclerotic calcification of the thoracic aorta. No aneurysmal dilatation. No CT evidence of pulmonary embolism. Mediastinum/Nodes: Mildly enlarged right hilar lymph node measures 16 mm in short axis. Mildly enlarged lymph nodes the right of the upper esophagus measure up to 11 mm short axis. The esophagus and the thyroid gland are grossly unremarkable. No mediastinal fluid collection. Lungs/Pleura: There is diffuse interstitial and interlobular septal prominence consistent with edema. Bilateral lower lobe diffuse ground-glass density as well as a cluster of ground-glass opacity in the right suprahilar region may represent edema. Atypical pneumonia is not excluded clinical correlation recommended. There is no pleural effusion pneumothorax. The central airways are patent. Upper Abdomen: No acute abnormality. Musculoskeletal: No chest wall  abnormality. No acute or significant osseous findings. Review of the MIP images confirms the above findings. IMPRESSION: 1. No CT evidence of pulmonary embolism. 2. Moderate cardiomegaly with findings of right heart dysfunction. 3. Bilateral lower lobe diffuse ground-glass density as well as a cluster of ground-glass opacity in the right suprahilar region likely represent edema. Atypical pneumonia is not excluded clinical correlation recommended. 4. Mildly enlarged right hilar and mediastinal lymph nodes, likely reactive. 5. Aortic Atherosclerosis (ICD10-I70.0). Electronically Signed   By: Elgie Collard M.D.   On: 07/24/2020 18:14   DG Chest Port 1 View  Result Date: 07/24/2020 CLINICAL DATA:  Dyspnea, hypoxia EXAM: PORTABLE CHEST 1 VIEW COMPARISON:  None. FINDINGS: The lungs are symmetrically well expanded. No pneumothorax or pleural effusion. There is mild bilateral perihilar and lower lung zone interstitial infiltrate possibly representing changes of mild interstitial pulmonary edema. There is mild to moderate cardiomegaly. No acute bone abnormality. IMPRESSION: Mild to moderate cardiomegaly. Mild interstitial pulmonary edema in keeping with mild cardiogenic failure. Electronically Signed   By: Helyn Numbers MD   On: 07/24/2020 13:38    Cardiac Studies   Echo pending  CT angio chest PE 07/24/20 - L heart enlargement. Coronary artery calcifications. Ao Atherosclerosis. Evidence of RH dysfunction with contrast reflux into IVC, with normal RV size (small chamber)  Patient Profile     60 y.o. male with tobacco use and untreated HTN presents with hypertensive urgency and shortness of breath with exertion and orthopnea.  Assessment & Plan   Active Problems:   NSTEMI (non-ST elevated myocardial infarction) (HCC)   Dyspnea on exertion   Orthopnea   Hypertensive urgency  He has evidence of troponin elevation in the setting of significant dyspnea on exertion over the past 6 to 12 months with  evidence of cardiomegaly and right heart dysfunction on CT.  He does have coronary artery calcifications.  CT also seems to suggest LVH.  Next step is an echocardiogram.  If regional wall motion abnormalities or LV dysfunction are present, would consider coronary angiography.  His volume status is improving rapidly, this could probably be planned for Monday.  With evidence of coronary artery calcifications, if there is LV dysfunction would proceed directly to coronary angiography.  Medications started by fellow include: -Aspirin 81 mg daily -Heparin IV -Atorvastatin 80 mg daily -Metoprolol tartrate 12.5 mg twice daily.  Though he does not appear to be fully decompensated and heart failure, he has at least class III symptoms and is tachycardic.  Metoprolol has already been given this morning, if LVEF is severely reduced could consider holding and optimizing heart failure therapy and volume  status. -Ins and outs have not been charted, but patient says he is urinating frequently.  Will give an additional dose of Lasix now and monitor. - will add hydralazine for BP management and adjust after results of echo.  For questions or updates, please contact [facility] Please consult [email or web address] for contact info under        Signed, Parke Poisson, MD  07/25/2020, 7:54 AM

## 2020-07-25 NOTE — ED Notes (Signed)
Cardiologist at bedside.  

## 2020-07-25 NOTE — ED Notes (Signed)
Heparin rate and bolus increased

## 2020-07-25 NOTE — Progress Notes (Signed)
ANTICOAGULATION CONSULT NOTE  Pharmacy Consult for heparin Indication: chest pain/ACS  No Known Allergies  Patient Measurements: Height: 5\' 9"  (175.3 cm) Weight: 104.5 kg (230 lb 6.1 oz) IBW/kg (Calculated) : 70.7 Heparin Dosing Weight: 93.2kg  Vital Signs: Temp: 98.7 F (37.1 C) (02/05 1616) Temp Source: Oral (02/05 1616) BP: 158/113 (02/05 1616) Pulse Rate: 95 (02/05 1616)  Labs: Recent Labs    07/24/20 1324 07/24/20 1339 07/24/20 1514 07/24/20 1648 07/25/20 0352 07/25/20 0809 07/25/20 1216 07/25/20 1852  HGB 15.0 16.0  --   --  14.7  --   --   --   HCT 47.7 47.0  --   --  48.3  --   --   --   PLT 236  --   --   --  235  --   --   --   HEPARINUNFRC  --   --   --   --  <0.10*  --  0.25* 0.32  CREATININE 1.52*  --   --   --   --  1.44*  --   --   TROPONINIHS  --   --  126* 164*  --   --   --   --     Estimated Creatinine Clearance: 67.4 mL/min (A) (by C-G formula based on SCr of 1.44 mg/dL (H)).  Assessment: 32 YOM presenting with UR sx and SOB, troponin elevated.  Not on anticoagulation PTA,  CBC wnl.    Heparin level now at goal 0.32 on rate of 1700 units/hr. No bleeding issues noted.   Goal of Therapy:  Heparin level 0.3-0.7 units/ml Monitor platelets by anticoagulation protocol: Yes   Plan:  Continue heparin gtt at 1700 units/hr Daily heparin level and cbc F/u cards plan  60 PharmD., BCPS Clinical Pharmacist 07/25/2020 7:41 PM

## 2020-07-26 DIAGNOSIS — I5041 Acute combined systolic (congestive) and diastolic (congestive) heart failure: Secondary | ICD-10-CM

## 2020-07-26 DIAGNOSIS — I5021 Acute systolic (congestive) heart failure: Secondary | ICD-10-CM

## 2020-07-26 DIAGNOSIS — I5043 Acute on chronic combined systolic (congestive) and diastolic (congestive) heart failure: Secondary | ICD-10-CM

## 2020-07-26 LAB — BASIC METABOLIC PANEL
Anion gap: 10 (ref 5–15)
BUN: 20 mg/dL (ref 6–20)
CO2: 28 mmol/L (ref 22–32)
Calcium: 8.9 mg/dL (ref 8.9–10.3)
Chloride: 103 mmol/L (ref 98–111)
Creatinine, Ser: 1.69 mg/dL — ABNORMAL HIGH (ref 0.61–1.24)
GFR, Estimated: 47 mL/min — ABNORMAL LOW (ref >60)
Glucose, Bld: 103 mg/dL — ABNORMAL HIGH (ref 70–99)
Potassium: 4 mmol/L (ref 3.5–5.1)
Sodium: 141 mmol/L (ref 135–145)

## 2020-07-26 LAB — CBC
HCT: 47.1 % (ref 39.0–52.0)
Hemoglobin: 14.9 g/dL (ref 13.0–17.0)
MCH: 24.7 pg — ABNORMAL LOW (ref 26.0–34.0)
MCHC: 31.6 g/dL (ref 30.0–36.0)
MCV: 78.1 fL — ABNORMAL LOW (ref 80.0–100.0)
Platelets: 230 10*3/uL (ref 150–400)
RBC: 6.03 MIL/uL — ABNORMAL HIGH (ref 4.22–5.81)
RDW: 16.1 % — ABNORMAL HIGH (ref 11.5–15.5)
WBC: 6.1 10*3/uL (ref 4.0–10.5)
nRBC: 0 % (ref 0.0–0.2)

## 2020-07-26 LAB — HEPARIN LEVEL (UNFRACTIONATED): Heparin Unfractionated: 0.43 IU/mL (ref 0.30–0.70)

## 2020-07-26 MED ORDER — HYDRALAZINE HCL 50 MG PO TABS
50.0000 mg | ORAL_TABLET | Freq: Three times a day (TID) | ORAL | Status: DC
  Administered 2020-07-26 – 2020-07-29 (×9): 50 mg via ORAL
  Filled 2020-07-26 (×9): qty 1

## 2020-07-26 MED ORDER — SODIUM CHLORIDE 0.9% FLUSH
3.0000 mL | Freq: Two times a day (BID) | INTRAVENOUS | Status: DC
  Administered 2020-07-26 – 2020-07-29 (×3): 3 mL via INTRAVENOUS

## 2020-07-26 MED ORDER — ISOSORBIDE MONONITRATE ER 30 MG PO TB24
30.0000 mg | ORAL_TABLET | Freq: Every day | ORAL | Status: DC
  Administered 2020-07-26 – 2020-07-29 (×4): 30 mg via ORAL
  Filled 2020-07-26 (×4): qty 1

## 2020-07-26 MED ORDER — DAPAGLIFLOZIN PROPANEDIOL 10 MG PO TABS
10.0000 mg | ORAL_TABLET | Freq: Every day | ORAL | Status: DC
  Administered 2020-07-26 – 2020-07-29 (×4): 10 mg via ORAL
  Filled 2020-07-26 (×4): qty 1

## 2020-07-26 NOTE — Progress Notes (Signed)
Progress Note  Patient Name: Dustin Fowler Date of Encounter: 07/26/2020  Primary Cardiologist: No primary care provider on file.   Subjective   Still feels congestion in his chest. No chest pain  Inpatient Medications    Scheduled Meds: . aspirin EC  81 mg Oral Daily  . atorvastatin  80 mg Oral Daily  . hydrALAZINE  25 mg Oral TID  . metoprolol tartrate  12.5 mg Oral BID   Continuous Infusions: . heparin 1,700 Units/hr (07/25/20 1328)   PRN Meds: acetaminophen, nitroGLYCERIN, ondansetron (ZOFRAN) IV   Vital Signs    Vitals:   07/26/20 0003 07/26/20 0519 07/26/20 0832 07/26/20 0957  BP: 136/79 (!) 161/96 (!) 143/118 (!) 173/130  Pulse: 76 83 92 90  Resp: 18 16 20 20   Temp: 98.3 F (36.8 C) 98.2 F (36.8 C) 98.3 F (36.8 C) 98.3 F (36.8 C)  TempSrc: Oral Oral Oral Oral  SpO2: 98% 98% 96% 99%  Weight:  97 kg    Height:        Intake/Output Summary (Last 24 hours) at 07/26/2020 0841 Last data filed at 07/26/2020 09/23/2020 Gross per 24 hour  Intake 1140 ml  Output 570 ml  Net 570 ml   Filed Weights   07/24/20 1317 07/26/20 0519  Weight: 104.5 kg 97 kg    Telemetry    SR - Personally Reviewed  ECG    ST, LVH, IVCD QRS 128 msec - Personally Reviewed  Physical Exam   GEN: No acute distress.   Neck: No JVD Cardiac: regular rhythm, normal rate, no murmurs, rubs, or gallops.  Respiratory: Clear to auscultation bilaterally. GI: Soft, nontender, non-distended  MS: No edema; No deformity. Neuro:  Nonfocal  Psych: Normal affect   Labs    Chemistry Recent Labs  Lab 07/24/20 1324 07/24/20 1339 07/25/20 0809 07/26/20 0439  NA 141 143 141 141  K 3.6 3.6 4.1 4.0  CL 106  --  105 103  CO2 25  --  24 28  GLUCOSE 112*  --  110* 103*  BUN 22*  --  17 20  CREATININE 1.52*  --  1.44* 1.69*  CALCIUM 8.7*  --  9.2 8.9  PROT 6.9  --   --   --   ALBUMIN 3.5  --   --   --   AST 28  --   --   --   ALT 24  --   --   --   ALKPHOS 73  --   --   --    BILITOT 0.7  --   --   --   GFRNONAA 53*  --  57* 47*  ANIONGAP 10  --  12 10     Hematology Recent Labs  Lab 07/24/20 1324 07/24/20 1339 07/25/20 0352 07/26/20 0439  WBC 6.5  --  6.5 6.1  RBC 6.05*  --  6.15* 6.03*  HGB 15.0 16.0 14.7 14.9  HCT 47.7 47.0 48.3 47.1  MCV 78.8*  --  78.5* 78.1*  MCH 24.8*  --  23.9* 24.7*  MCHC 31.4  --  30.4 31.6  RDW 16.8*  --  16.2* 16.1*  PLT 236  --  235 230    Cardiac EnzymesNo results for input(s): TROPONINI in the last 168 hours. No results for input(s): TROPIPOC in the last 168 hours.   BNP Recent Labs  Lab 07/24/20 1325  BNP 330.7*     DDimer  Recent Labs  Lab 07/24/20 1528  DDIMER 3.32*     Radiology    CT Angio Chest PE W and/or Wo Contrast  Result Date: 07/24/2020 CLINICAL DATA:  60 year old male with concern for pulmonary embolism. EXAM: CT ANGIOGRAPHY CHEST WITH CONTRAST TECHNIQUE: Multidetector CT imaging of the chest was performed using the standard protocol during bolus administration of intravenous contrast. Multiplanar CT image reconstructions and MIPs were obtained to evaluate the vascular anatomy. CONTRAST:  40mL OMNIPAQUE IOHEXOL 350 MG/ML SOLN COMPARISON:  Chest radiograph dated 07/24/2020. FINDINGS: Cardiovascular: There is moderate cardiomegaly. No pericardial effusion. Retrograde flow of contrast from the right atrium into the IVC suggestive of a degree of right heart dysfunction. Mild atherosclerotic calcification of the thoracic aorta. No aneurysmal dilatation. No CT evidence of pulmonary embolism. Mediastinum/Nodes: Mildly enlarged right hilar lymph node measures 16 mm in short axis. Mildly enlarged lymph nodes the right of the upper esophagus measure up to 11 mm short axis. The esophagus and the thyroid gland are grossly unremarkable. No mediastinal fluid collection. Lungs/Pleura: There is diffuse interstitial and interlobular septal prominence consistent with edema. Bilateral lower lobe diffuse ground-glass  density as well as a cluster of ground-glass opacity in the right suprahilar region may represent edema. Atypical pneumonia is not excluded clinical correlation recommended. There is no pleural effusion pneumothorax. The central airways are patent. Upper Abdomen: No acute abnormality. Musculoskeletal: No chest wall abnormality. No acute or significant osseous findings. Review of the MIP images confirms the above findings. IMPRESSION: 1. No CT evidence of pulmonary embolism. 2. Moderate cardiomegaly with findings of right heart dysfunction. 3. Bilateral lower lobe diffuse ground-glass density as well as a cluster of ground-glass opacity in the right suprahilar region likely represent edema. Atypical pneumonia is not excluded clinical correlation recommended. 4. Mildly enlarged right hilar and mediastinal lymph nodes, likely reactive. 5. Aortic Atherosclerosis (ICD10-I70.0). Electronically Signed   By: Elgie Collard M.D.   On: 07/24/2020 18:14   DG Chest Port 1 View  Result Date: 07/24/2020 CLINICAL DATA:  Dyspnea, hypoxia EXAM: PORTABLE CHEST 1 VIEW COMPARISON:  None. FINDINGS: The lungs are symmetrically well expanded. No pneumothorax or pleural effusion. There is mild bilateral perihilar and lower lung zone interstitial infiltrate possibly representing changes of mild interstitial pulmonary edema. There is mild to moderate cardiomegaly. No acute bone abnormality. IMPRESSION: Mild to moderate cardiomegaly. Mild interstitial pulmonary edema in keeping with mild cardiogenic failure. Electronically Signed   By: Helyn Numbers MD   On: 07/24/2020 13:38   ECHOCARDIOGRAM COMPLETE  Result Date: 07/25/2020    ECHOCARDIOGRAM REPORT   Patient Name:   Dustin Fowler Date of Exam: 07/25/2020 Medical Rec #:  [id number]          Height:       69.0 in Accession #:    [id number]         Weight:       230.4 lb Date of Birth:  09-Nov-1960          BSA:          2.194 m Patient Age:    59 years           BP:           169/101  mmHg Patient Gender: M                  HR:           78 bpm. Exam Location:  Inpatient Procedure: 2D Echo, Cardiac Doppler, Color Doppler and Intracardiac  Opacification Agent Indications:    I50.23 Acute on chronic systolic (congestive) heart failure  History:        Patient has no prior history of Echocardiogram examinations.  Sonographer:    Elmarie Shiley Dance Referring Phys: [id number] AMANDA C CONIGLIO IMPRESSIONS  1. Definity contrast given but image quality remains suboptimal. Apex appears akinetic. There may also be anteroseptal hypokinesis, and possible basal septal thinning. Consider coronary angiogram and/or cardiac MRI as part of evaluation to further assess cardiomyopathy.  2. Left ventricular ejection fraction, by estimation, is 35 to 40%. The left ventricle has moderately decreased function. Left ventricular endocardial border not optimally defined to evaluate regional wall motion. There is severe asymmetric left ventricular hypertrophy of the posterior-lateral segment. Left ventricular diastolic parameters are consistent with Grade III diastolic dysfunction (restrictive). Elevated left ventricular end-diastolic pressure.  3. Right ventricular systolic function is moderately reduced. The right ventricular size is normal.  4. Left atrial size was severely dilated.  5. The mitral valve is normal in structure. Mild mitral valve regurgitation. No evidence of mitral stenosis.  6. The aortic valve is normal in structure. Aortic valve regurgitation is not visualized. No aortic stenosis is present.  7. The inferior vena cava is dilated in size with >50% respiratory variability, suggesting right atrial pressure of 8 mmHg. FINDINGS  Left Ventricle: Left ventricular ejection fraction, by estimation, is 35 to 40%. The left ventricle has moderately decreased function. Left ventricular endocardial border not optimally defined to evaluate regional wall motion. Definity contrast agent was given IV to delineate the  left ventricular endocardial borders. The left ventricular internal cavity size was normal in size. There is severe asymmetric left ventricular hypertrophy of the posterior-lateral segment. Left ventricular diastolic parameters are consistent with Grade III diastolic dysfunction (restrictive). Elevated left ventricular end-diastolic pressure.  LV Wall Scoring: The apical lateral segment, apical septal segment, apical anterior segment, and apical inferior segment are akinetic. Right Ventricle: The right ventricular size is normal. No increase in right ventricular wall thickness. Right ventricular systolic function is moderately reduced. Left Atrium: Left atrial size was severely dilated. Right Atrium: Right atrial size was normal in size. Pericardium: There is no evidence of pericardial effusion. Mitral Valve: The mitral valve is normal in structure. Mild mitral valve regurgitation. No evidence of mitral valve stenosis. Tricuspid Valve: The tricuspid valve is normal in structure. Tricuspid valve regurgitation is trivial. No evidence of tricuspid stenosis. Aortic Valve: The aortic valve is normal in structure. Aortic valve regurgitation is not visualized. No aortic stenosis is present. Pulmonic Valve: The pulmonic valve was normal in structure. Pulmonic valve regurgitation is trivial. No evidence of pulmonic stenosis. Aorta: The aortic root is normal in size and structure. Venous: The inferior vena cava is dilated in size with greater than 50% respiratory variability, suggesting right atrial pressure of 8 mmHg. IAS/Shunts: No atrial level shunt detected by color flow Doppler.  LEFT VENTRICLE PLAX 2D LVIDd:         5.40 cm  Diastology LVIDs:         4.10 cm  LV e' medial:    3.98 cm/s LV PW:         2.30 cm  LV E/e' medial:  28.9 LV IVS:        1.50 cm  LV e' lateral:   5.63 cm/s LVOT diam:     2.10 cm  LV E/e' lateral: 20.4 LV SV:         42 LV SV Index:   19  LVOT Area:     3.46 cm  RIGHT VENTRICLE            IVC RV  Basal diam:  2.90 cm    IVC diam: 2.60 cm RV S prime:     8.10 cm/s TAPSE (M-mode): 1.7 cm LEFT ATRIUM              Index       RIGHT ATRIUM           Index LA diam:        5.20 cm  2.37 cm/m  RA Area:     17.70 cm LA Vol (A2C):   121.0 ml 55.15 ml/m RA Volume:   52.70 ml  24.02 ml/m LA Vol (A4C):   137.0 ml 62.44 ml/m LA Biplane Vol: 131.0 ml 59.71 ml/m  AORTIC VALVE LVOT Vmax:   84.40 cm/s LVOT Vmean:  52.650 cm/s LVOT VTI:    0.121 m  AORTA Ao Root diam: 3.50 cm Ao Asc diam:  3.20 cm MITRAL VALVE MV Area (PHT): 4.49 cm     SHUNTS MV Decel Time: 169 msec     Systemic VTI:  0.12 m MV E velocity: 115.00 cm/s  Systemic Diam: 2.10 cm MV A velocity: 52.10 cm/s MV E/A ratio:  2.21 Weston BrassGayatri Chester Romero MD Electronically signed by Weston BrassGayatri Abiel Antrim MD Signature Date/Time: 07/25/2020/1:56:12 PM    Final     Cardiac Studies   Echo 07/25/20 1. Definity contrast given but image quality remains suboptimal. Apex  appears akinetic. There may also be anteroseptal hypokinesis, and possible  basal septal thinning. Consider coronary angiogram and/or cardiac MRI as  part of evaluation to further  assess cardiomyopathy.   2. Left ventricular ejection fraction, by estimation, is 35 to 40%. The  left ventricle has moderately decreased function. Left ventricular  endocardial border not optimally defined to evaluate regional wall motion.  There is severe asymmetric left  ventricular hypertrophy of the posterior-lateral segment. Left ventricular  diastolic parameters are consistent with Grade III diastolic dysfunction  (restrictive). Elevated left ventricular end-diastolic pressure.   3. Right ventricular systolic function is moderately reduced. The right  ventricular size is normal.   4. Left atrial size was severely dilated.   5. The mitral valve is normal in structure. Mild mitral valve  regurgitation. No evidence of mitral stenosis.   6. The aortic valve is normal in structure. Aortic valve regurgitation is  not  visualized. No aortic stenosis is present.   7. The inferior vena cava is dilated in size with >50% respiratory  variability, suggesting right atrial pressure of 8 mmHg.    Patient Profile     60 y.o. male with tobacco use and untreated HTN presents with hypertensive urgency and shortness of breath with exertion and orthopnea, now found to have acute systolic HF and hypertensive urgency.   Assessment & Plan   Active Problems:   NSTEMI (non-ST elevated myocardial infarction) (HCC)   Dyspnea on exertion   Orthopnea   Hypertensive urgency   Acute systolic HF (heart failure) (HCC)   He does not take any medications at home and has severe hypertension.  We are somewhat limited by renal function but ideally we should start heart failure therapy after cath. -Currently on aspirin 81 mg daily, atorvastatin 80 mg daily, and IV heparin for NSTEMI. -For hypertension I will uptitrate hydralazine 50 mg 3 times daily. - imdur 30 mg will start today.  - will start farxiga 10 mg daily -would ideally  like to use entresto and spironolactone. Will see what renal function looks like after cath and discuss with HF pharmacist if patient assistance needed. - continue lasix 40 mg IV BID today , he still has sx of chest congestion that is improving since admission.  - will plan for [facility] tomorrow. He has cor cals on his CTA PE study, will need to exclude ischemia. If no coronary disease, suspect either HTN heart disease or cardiomyopathic process. There is a suggestion of basal septal thinning on his echo though it is only seen in one contrast clip. Could be a sign of cardiac sarcoidosis. If cath is non-obstructive, consider cardiac MRI to delineate etiology of reduced EF.   INFORMED CONSENT: I have reviewed the risks, indications, and alternatives to cardiac catheterization, possible angioplasty, and stenting with the patient. Risks include but are not limited to bleeding, infection, vascular injury, stroke,  myocardial infection, arrhythmia, kidney injury, radiation-related injury in the case of prolonged fluoroscopy use, emergency cardiac surgery, and death. The patient understands the risks of serious complication is 1-2 in 1000 with diagnostic cardiac cath and 1-2% or less with angioplasty/stenting.    For questions or updates, please contact [facility] Please consult [email or web address] for contact info under        Signed, Parke Poisson, MD  07/26/2020, 8:41 AM

## 2020-07-26 NOTE — Progress Notes (Signed)
ANTICOAGULATION CONSULT NOTE  Pharmacy Consult for heparin Indication: chest pain/ACS  No Known Allergies  Patient Measurements: Height: 5\' 9"  (175.3 cm) Weight: 97 kg (213 lb 14.4 oz) IBW/kg (Calculated) : 70.7 Heparin Dosing Weight: 93.2kg  Vital Signs: Temp: 98.3 F (36.8 C) (02/06 0832) Temp Source: Oral (02/06 0832) BP: 143/118 (02/06 0832) Pulse Rate: 92 (02/06 0832)  Labs: Recent Labs    07/24/20 1324 07/24/20 1339 07/24/20 1339 07/24/20 1514 07/24/20 1648 07/25/20 0352 07/25/20 0809 07/25/20 1216 07/25/20 1852 07/26/20 0439  HGB 15.0 16.0  --   --   --  14.7  --   --   --  14.9  HCT 47.7 47.0  --   --   --  48.3  --   --   --  47.1  PLT 236  --   --   --   --  235  --   --   --  230  HEPARINUNFRC  --   --    < >  --   --  <0.10*  --  0.25* 0.32 0.43  CREATININE 1.52*  --   --   --   --   --  1.44*  --   --  1.69*  TROPONINIHS  --   --   --  126* 164*  --   --   --   --   --    < > = values in this interval not displayed.    Estimated Creatinine Clearance: 55.4 mL/min (A) (by C-G formula based on SCr of 1.69 mg/dL (H)).  Assessment: 12 YOM presenting with UR sx and SOB, troponin elevated.  Not on anticoagulation PTA, CBC wnl.    Heparin level therapeutic at 0.43 on rate of 1,700 units/hr. No bleeding issues noted.   Goal of Therapy:  Heparin level 0.3-0.7 units/ml Monitor platelets by anticoagulation protocol: Yes   Plan:  Continue heparin gtt at 1,700 units/hr Daily heparin level and cbc F/u cards plan  60, PharmD PGY1 Acute Care Pharmacy Resident Please refer to [facility] for unit-specific pharmacist

## 2020-07-26 NOTE — H&P (View-Only) (Signed)
Progress Note  Patient Name: Dustin Fowler Date of Encounter: 07/26/2020  Primary Cardiologist: No primary care provider on file.   Subjective   Still feels congestion in his chest. No chest pain  Inpatient Medications    Scheduled Meds: . aspirin EC  81 mg Oral Daily  . atorvastatin  80 mg Oral Daily  . hydrALAZINE  25 mg Oral TID  . metoprolol tartrate  12.5 mg Oral BID   Continuous Infusions: . heparin 1,700 Units/hr (07/25/20 1328)   PRN Meds: acetaminophen, nitroGLYCERIN, ondansetron (ZOFRAN) IV   Vital Signs    Vitals:   07/26/20 0003 07/26/20 0519 07/26/20 0832 07/26/20 0957  BP: 136/79 (!) 161/96 (!) 143/118 (!) 173/130  Pulse: 76 83 92 90  Resp: 18 16 20 20   Temp: 98.3 F (36.8 C) 98.2 F (36.8 C) 98.3 F (36.8 C) 98.3 F (36.8 C)  TempSrc: Oral Oral Oral Oral  SpO2: 98% 98% 96% 99%  Weight:  97 kg    Height:        Intake/Output Summary (Last 24 hours) at 07/26/2020 0841 Last data filed at 07/26/2020 09/23/2020 Gross per 24 hour  Intake 1140 ml  Output 570 ml  Net 570 ml   Filed Weights   07/24/20 1317 07/26/20 0519  Weight: 104.5 kg 97 kg    Telemetry    SR - Personally Reviewed  ECG    ST, LVH, IVCD QRS 128 msec - Personally Reviewed  Physical Exam   GEN: No acute distress.   Neck: No JVD Cardiac: regular rhythm, normal rate, no murmurs, rubs, or gallops.  Respiratory: Clear to auscultation bilaterally. GI: Soft, nontender, non-distended  MS: No edema; No deformity. Neuro:  Nonfocal  Psych: Normal affect   Labs    Chemistry Recent Labs  Lab 07/24/20 1324 07/24/20 1339 07/25/20 0809 07/26/20 0439  NA 141 143 141 141  K 3.6 3.6 4.1 4.0  CL 106  --  105 103  CO2 25  --  24 28  GLUCOSE 112*  --  110* 103*  BUN 22*  --  17 20  CREATININE 1.52*  --  1.44* 1.69*  CALCIUM 8.7*  --  9.2 8.9  PROT 6.9  --   --   --   ALBUMIN 3.5  --   --   --   AST 28  --   --   --   ALT 24  --   --   --   ALKPHOS 73  --   --   --    BILITOT 0.7  --   --   --   GFRNONAA 53*  --  57* 47*  ANIONGAP 10  --  12 10     Hematology Recent Labs  Lab 07/24/20 1324 07/24/20 1339 07/25/20 0352 07/26/20 0439  WBC 6.5  --  6.5 6.1  RBC 6.05*  --  6.15* 6.03*  HGB 15.0 16.0 14.7 14.9  HCT 47.7 47.0 48.3 47.1  MCV 78.8*  --  78.5* 78.1*  MCH 24.8*  --  23.9* 24.7*  MCHC 31.4  --  30.4 31.6  RDW 16.8*  --  16.2* 16.1*  PLT 236  --  235 230    Cardiac EnzymesNo results for input(s): TROPONINI in the last 168 hours. No results for input(s): TROPIPOC in the last 168 hours.   BNP Recent Labs  Lab 07/24/20 1325  BNP 330.7*     DDimer  Recent Labs  Lab 07/24/20 1528  DDIMER 3.32*     Radiology    CT Angio Chest PE W and/or Wo Contrast  Result Date: 07/24/2020 CLINICAL DATA:  60 year old male with concern for pulmonary embolism. EXAM: CT ANGIOGRAPHY CHEST WITH CONTRAST TECHNIQUE: Multidetector CT imaging of the chest was performed using the standard protocol during bolus administration of intravenous contrast. Multiplanar CT image reconstructions and MIPs were obtained to evaluate the vascular anatomy. CONTRAST:  40mL OMNIPAQUE IOHEXOL 350 MG/ML SOLN COMPARISON:  Chest radiograph dated 07/24/2020. FINDINGS: Cardiovascular: There is moderate cardiomegaly. No pericardial effusion. Retrograde flow of contrast from the right atrium into the IVC suggestive of a degree of right heart dysfunction. Mild atherosclerotic calcification of the thoracic aorta. No aneurysmal dilatation. No CT evidence of pulmonary embolism. Mediastinum/Nodes: Mildly enlarged right hilar lymph node measures 16 mm in short axis. Mildly enlarged lymph nodes the right of the upper esophagus measure up to 11 mm short axis. The esophagus and the thyroid gland are grossly unremarkable. No mediastinal fluid collection. Lungs/Pleura: There is diffuse interstitial and interlobular septal prominence consistent with edema. Bilateral lower lobe diffuse ground-glass  density as well as a cluster of ground-glass opacity in the right suprahilar region may represent edema. Atypical pneumonia is not excluded clinical correlation recommended. There is no pleural effusion pneumothorax. The central airways are patent. Upper Abdomen: No acute abnormality. Musculoskeletal: No chest wall abnormality. No acute or significant osseous findings. Review of the MIP images confirms the above findings. IMPRESSION: 1. No CT evidence of pulmonary embolism. 2. Moderate cardiomegaly with findings of right heart dysfunction. 3. Bilateral lower lobe diffuse ground-glass density as well as a cluster of ground-glass opacity in the right suprahilar region likely represent edema. Atypical pneumonia is not excluded clinical correlation recommended. 4. Mildly enlarged right hilar and mediastinal lymph nodes, likely reactive. 5. Aortic Atherosclerosis (ICD10-I70.0). Electronically Signed   By: Elgie Collard M.D.   On: 07/24/2020 18:14   DG Chest Port 1 View  Result Date: 07/24/2020 CLINICAL DATA:  Dyspnea, hypoxia EXAM: PORTABLE CHEST 1 VIEW COMPARISON:  None. FINDINGS: The lungs are symmetrically well expanded. No pneumothorax or pleural effusion. There is mild bilateral perihilar and lower lung zone interstitial infiltrate possibly representing changes of mild interstitial pulmonary edema. There is mild to moderate cardiomegaly. No acute bone abnormality. IMPRESSION: Mild to moderate cardiomegaly. Mild interstitial pulmonary edema in keeping with mild cardiogenic failure. Electronically Signed   By: Helyn Numbers MD   On: 07/24/2020 13:38   ECHOCARDIOGRAM COMPLETE  Result Date: 07/25/2020    ECHOCARDIOGRAM REPORT   Patient Name:   Dustin Fowler Date of Exam: 07/25/2020 Medical Rec #:  [id number]          Height:       69.0 in Accession #:    [id number]         Weight:       230.4 lb Date of Birth:  09-Nov-1960          BSA:          2.194 m Patient Age:    60 years           BP:           169/101  mmHg Patient Gender: M                  HR:           78 bpm. Exam Location:  Inpatient Procedure: 2D Echo, Cardiac Doppler, Color Doppler and Intracardiac  Opacification Agent Indications:    I50.23 Acute on chronic systolic (congestive) heart failure  History:        Patient has no prior history of Echocardiogram examinations.  Sonographer:    Elmarie Shiley Dance Referring Phys: [id number] AMANDA C CONIGLIO IMPRESSIONS  1. Definity contrast given but image quality remains suboptimal. Apex appears akinetic. There may also be anteroseptal hypokinesis, and possible basal septal thinning. Consider coronary angiogram and/or cardiac MRI as part of evaluation to further assess cardiomyopathy.  2. Left ventricular ejection fraction, by estimation, is 35 to 40%. The left ventricle has moderately decreased function. Left ventricular endocardial border not optimally defined to evaluate regional wall motion. There is severe asymmetric left ventricular hypertrophy of the posterior-lateral segment. Left ventricular diastolic parameters are consistent with Grade III diastolic dysfunction (restrictive). Elevated left ventricular end-diastolic pressure.  3. Right ventricular systolic function is moderately reduced. The right ventricular size is normal.  4. Left atrial size was severely dilated.  5. The mitral valve is normal in structure. Mild mitral valve regurgitation. No evidence of mitral stenosis.  6. The aortic valve is normal in structure. Aortic valve regurgitation is not visualized. No aortic stenosis is present.  7. The inferior vena cava is dilated in size with >50% respiratory variability, suggesting right atrial pressure of 8 mmHg. FINDINGS  Left Ventricle: Left ventricular ejection fraction, by estimation, is 35 to 40%. The left ventricle has moderately decreased function. Left ventricular endocardial border not optimally defined to evaluate regional wall motion. Definity contrast agent was given IV to delineate the  left ventricular endocardial borders. The left ventricular internal cavity size was normal in size. There is severe asymmetric left ventricular hypertrophy of the posterior-lateral segment. Left ventricular diastolic parameters are consistent with Grade III diastolic dysfunction (restrictive). Elevated left ventricular end-diastolic pressure.  LV Wall Scoring: The apical lateral segment, apical septal segment, apical anterior segment, and apical inferior segment are akinetic. Right Ventricle: The right ventricular size is normal. No increase in right ventricular wall thickness. Right ventricular systolic function is moderately reduced. Left Atrium: Left atrial size was severely dilated. Right Atrium: Right atrial size was normal in size. Pericardium: There is no evidence of pericardial effusion. Mitral Valve: The mitral valve is normal in structure. Mild mitral valve regurgitation. No evidence of mitral valve stenosis. Tricuspid Valve: The tricuspid valve is normal in structure. Tricuspid valve regurgitation is trivial. No evidence of tricuspid stenosis. Aortic Valve: The aortic valve is normal in structure. Aortic valve regurgitation is not visualized. No aortic stenosis is present. Pulmonic Valve: The pulmonic valve was normal in structure. Pulmonic valve regurgitation is trivial. No evidence of pulmonic stenosis. Aorta: The aortic root is normal in size and structure. Venous: The inferior vena cava is dilated in size with greater than 50% respiratory variability, suggesting right atrial pressure of 8 mmHg. IAS/Shunts: No atrial level shunt detected by color flow Doppler.  LEFT VENTRICLE PLAX 2D LVIDd:         5.40 cm  Diastology LVIDs:         4.10 cm  LV e' medial:    3.98 cm/s LV PW:         2.30 cm  LV E/e' medial:  28.9 LV IVS:        1.50 cm  LV e' lateral:   5.63 cm/s LVOT diam:     2.10 cm  LV E/e' lateral: 20.4 LV SV:         42 LV SV Index:   19  LVOT Area:     3.46 cm  RIGHT VENTRICLE            IVC RV  Basal diam:  2.90 cm    IVC diam: 2.60 cm RV S prime:     8.10 cm/s TAPSE (M-mode): 1.7 cm LEFT ATRIUM              Index       RIGHT ATRIUM           Index LA diam:        5.20 cm  2.37 cm/m  RA Area:     17.70 cm LA Vol (A2C):   121.0 ml 55.15 ml/m RA Volume:   52.70 ml  24.02 ml/m LA Vol (A4C):   137.0 ml 62.44 ml/m LA Biplane Vol: 131.0 ml 59.71 ml/m  AORTIC VALVE LVOT Vmax:   84.40 cm/s LVOT Vmean:  52.650 cm/s LVOT VTI:    0.121 m  AORTA Ao Root diam: 3.50 cm Ao Asc diam:  3.20 cm MITRAL VALVE MV Area (PHT): 4.49 cm     SHUNTS MV Decel Time: 169 msec     Systemic VTI:  0.12 m MV E velocity: 115.00 cm/s  Systemic Diam: 2.10 cm MV A velocity: 52.10 cm/s MV E/A ratio:  2.21 Gayatri Acharya MD Electronically signed by Gayatri Acharya MD Signature Date/Time: 07/25/2020/1:56:12 PM    Final     Cardiac Studies   Echo 07/25/20 1. Definity contrast given but image quality remains suboptimal. Apex  appears akinetic. There may also be anteroseptal hypokinesis, and possible  basal septal thinning. Consider coronary angiogram and/or cardiac MRI as  part of evaluation to further  assess cardiomyopathy.   2. Left ventricular ejection fraction, by estimation, is 35 to 40%. The  left ventricle has moderately decreased function. Left ventricular  endocardial border not optimally defined to evaluate regional wall motion.  There is severe asymmetric left  ventricular hypertrophy of the posterior-lateral segment. Left ventricular  diastolic parameters are consistent with Grade III diastolic dysfunction  (restrictive). Elevated left ventricular end-diastolic pressure.   3. Right ventricular systolic function is moderately reduced. The right  ventricular size is normal.   4. Left atrial size was severely dilated.   5. The mitral valve is normal in structure. Mild mitral valve  regurgitation. No evidence of mitral stenosis.   6. The aortic valve is normal in structure. Aortic valve regurgitation is  not  visualized. No aortic stenosis is present.   7. The inferior vena cava is dilated in size with >50% respiratory  variability, suggesting right atrial pressure of 8 mmHg.    Patient Profile     59 y.o. male with tobacco use and untreated HTN presents with hypertensive urgency and shortness of breath with exertion and orthopnea, now found to have acute systolic HF and hypertensive urgency.   Assessment & Plan   Active Problems:   NSTEMI (non-ST elevated myocardial infarction) (HCC)   Dyspnea on exertion   Orthopnea   Hypertensive urgency   Acute systolic HF (heart failure) (HCC)   He does not take any medications at home and has severe hypertension.  We are somewhat limited by renal function but ideally we should start heart failure therapy after cath. -Currently on aspirin 81 mg daily, atorvastatin 80 mg daily, and IV heparin for NSTEMI. -For hypertension I will uptitrate hydralazine 50 mg 3 times daily. - imdur 30 mg will start today.  - will start farxiga 10 mg daily -would ideally   like to use entresto and spironolactone. Will see what renal function looks like after cath and discuss with HF pharmacist if patient assistance needed. - continue lasix 40 mg IV BID today , he still has sx of chest congestion that is improving since admission.  - will plan for [facility] tomorrow. He has cor cals on his CTA PE study, will need to exclude ischemia. If no coronary disease, suspect either HTN heart disease or cardiomyopathic process. There is a suggestion of basal septal thinning on his echo though it is only seen in one contrast clip. Could be a sign of cardiac sarcoidosis. If cath is non-obstructive, consider cardiac MRI to delineate etiology of reduced EF.   INFORMED CONSENT: I have reviewed the risks, indications, and alternatives to cardiac catheterization, possible angioplasty, and stenting with the patient. Risks include but are not limited to bleeding, infection, vascular injury, stroke,  myocardial infection, arrhythmia, kidney injury, radiation-related injury in the case of prolonged fluoroscopy use, emergency cardiac surgery, and death. The patient understands the risks of serious complication is 1-2 in 1000 with diagnostic cardiac cath and 1-2% or less with angioplasty/stenting.    For questions or updates, please contact [facility] Please consult [email or web address] for contact info under        Signed, Parke Poisson, MD  07/26/2020, 8:41 AM

## 2020-07-27 ENCOUNTER — Encounter (HOSPITAL_COMMUNITY)
Admission: EM | Disposition: A | Discharge status: Home / Self Care | Payer: Self-pay | Source: Home / Self Care | Attending: Internal Medicine

## 2020-07-27 ENCOUNTER — Encounter (HOSPITAL_COMMUNITY): Payer: Self-pay | Admitting: Internal Medicine

## 2020-07-27 DIAGNOSIS — I2510 Atherosclerotic heart disease of native coronary artery without angina pectoris: Secondary | ICD-10-CM

## 2020-07-27 DIAGNOSIS — I214 Non-ST elevation (NSTEMI) myocardial infarction: Principal | ICD-10-CM

## 2020-07-27 HISTORY — PX: RIGHT/LEFT HEART CATH AND CORONARY ANGIOGRAPHY: CATH118266

## 2020-07-27 LAB — CBC
HCT: 49.4 % (ref 39.0–52.0)
Hemoglobin: 15 g/dL (ref 13.0–17.0)
MCH: 23.8 pg — ABNORMAL LOW (ref 26.0–34.0)
MCHC: 30.4 g/dL (ref 30.0–36.0)
MCV: 78.4 fL — ABNORMAL LOW (ref 80.0–100.0)
Platelets: 215 10*3/uL (ref 150–400)
RBC: 6.3 MIL/uL — ABNORMAL HIGH (ref 4.22–5.81)
RDW: 16.2 % — ABNORMAL HIGH (ref 11.5–15.5)
WBC: 6.3 10*3/uL (ref 4.0–10.5)
nRBC: 0 % (ref 0.0–0.2)

## 2020-07-27 LAB — BASIC METABOLIC PANEL
Anion gap: 13 (ref 5–15)
BUN: 17 mg/dL (ref 6–20)
CO2: 22 mmol/L (ref 22–32)
Calcium: 8.8 mg/dL — ABNORMAL LOW (ref 8.9–10.3)
Chloride: 104 mmol/L (ref 98–111)
Creatinine, Ser: 1.47 mg/dL — ABNORMAL HIGH (ref 0.61–1.24)
GFR, Estimated: 55 mL/min — ABNORMAL LOW (ref >60)
Glucose, Bld: 89 mg/dL (ref 70–99)
Potassium: 3.8 mmol/L (ref 3.5–5.1)
Sodium: 139 mmol/L (ref 135–145)

## 2020-07-27 LAB — HEPARIN LEVEL (UNFRACTIONATED): Heparin Unfractionated: 0.49 IU/mL (ref 0.30–0.70)

## 2020-07-27 LAB — POCT I-STAT 7, (LYTES, BLD GAS, ICA,H+H)
Acid-Base Excess: 0 mmol/L (ref 0.0–2.0)
Bicarbonate: 23.2 mmol/L (ref 20.0–28.0)
Calcium, Ion: 1.17 mmol/L (ref 1.15–1.40)
HCT: 46 % (ref 39.0–52.0)
Hemoglobin: 15.6 g/dL (ref 13.0–17.0)
O2 Saturation: 98 %
Potassium: 3.7 mmol/L (ref 3.5–5.1)
Sodium: 141 mmol/L (ref 135–145)
TCO2: 24 mmol/L (ref 22–32)
pCO2 arterial: 32.7 mmHg (ref 32.0–48.0)
pH, Arterial: 7.459 — ABNORMAL HIGH (ref 7.350–7.450)
pO2, Arterial: 92 mmHg (ref 83.0–108.0)

## 2020-07-27 LAB — POCT I-STAT EG7
Acid-Base Excess: 1 mmol/L (ref 0.0–2.0)
Bicarbonate: 25.6 mmol/L (ref 20.0–28.0)
Calcium, Ion: 1.22 mmol/L (ref 1.15–1.40)
HCT: 47 % (ref 39.0–52.0)
Hemoglobin: 16 g/dL (ref 13.0–17.0)
O2 Saturation: 75 %
Potassium: 3.9 mmol/L (ref 3.5–5.1)
Sodium: 141 mmol/L (ref 135–145)
TCO2: 27 mmol/L (ref 22–32)
pCO2, Ven: 38.8 mmHg — ABNORMAL LOW (ref 44.0–60.0)
pH, Ven: 7.428 (ref 7.250–7.430)
pO2, Ven: 39 mmHg (ref 32.0–45.0)

## 2020-07-27 SURGERY — RIGHT/LEFT HEART CATH AND CORONARY ANGIOGRAPHY
Anesthesia: LOCAL

## 2020-07-27 MED ORDER — HEPARIN SODIUM (PORCINE) 1000 UNIT/ML IJ SOLN
INTRAMUSCULAR | Status: DC | PRN
  Administered 2020-07-27: 5000 [IU] via INTRAVENOUS

## 2020-07-27 MED ORDER — LIDOCAINE HCL (PF) 1 % IJ SOLN
INTRAMUSCULAR | Status: AC
  Filled 2020-07-27: qty 30

## 2020-07-27 MED ORDER — HYDRALAZINE HCL 20 MG/ML IJ SOLN: 10.0000 mg | INTRAMUSCULAR | Status: AC | PRN

## 2020-07-27 MED ORDER — IOHEXOL 350 MG/ML SOLN
INTRAVENOUS | Status: DC | PRN
  Administered 2020-07-27: 80 mL

## 2020-07-27 MED ORDER — CARVEDILOL 6.25 MG PO TABS
6.2500 mg | ORAL_TABLET | Freq: Two times a day (BID) | ORAL | Status: DC
  Administered 2020-07-27 – 2020-07-29 (×4): 6.25 mg via ORAL
  Filled 2020-07-27 (×6): qty 1

## 2020-07-27 MED ORDER — SODIUM CHLORIDE 0.9 % IV SOLN
250.0000 mL | INTRAVENOUS | Status: DC | PRN
Start: 2020-07-27 — End: 2020-07-29

## 2020-07-27 MED ORDER — HEPARIN SODIUM (PORCINE) 5000 UNIT/ML IJ SOLN
5000.0000 [IU] | Freq: Three times a day (TID) | INTRAMUSCULAR | Status: DC
  Administered 2020-07-27 – 2020-07-28 (×2): 5000 [IU] via SUBCUTANEOUS
  Filled 2020-07-27 (×4): qty 1

## 2020-07-27 MED ORDER — SACUBITRIL-VALSARTAN 24-26 MG PO TABS
1.0000 | ORAL_TABLET | Freq: Two times a day (BID) | ORAL | Status: DC
  Administered 2020-07-27 (×2): 1 via ORAL
  Filled 2020-07-27 (×2): qty 1

## 2020-07-27 MED ORDER — MIDAZOLAM HCL 2 MG/2ML IJ SOLN
INTRAMUSCULAR | Status: AC
  Filled 2020-07-27: qty 2

## 2020-07-27 MED ORDER — SODIUM CHLORIDE 0.9 % IV SOLN: INTRAVENOUS | Status: DC

## 2020-07-27 MED ORDER — ASPIRIN 81 MG PO CHEW: 81.0000 mg | CHEWABLE_TABLET | ORAL | Status: DC

## 2020-07-27 MED ORDER — SODIUM CHLORIDE 0.9% FLUSH: 3.0000 mL | INTRAVENOUS | Status: DC | PRN

## 2020-07-27 MED ORDER — ASPIRIN EC 81 MG PO TBEC
81.0000 mg | DELAYED_RELEASE_TABLET | Freq: Every day | ORAL | Status: DC
  Administered 2020-07-28 – 2020-07-29 (×2): 81 mg via ORAL
  Filled 2020-07-27 (×2): qty 1

## 2020-07-27 MED ORDER — VERAPAMIL HCL 2.5 MG/ML IV SOLN
INTRAVENOUS | Status: AC
  Filled 2020-07-27: qty 2

## 2020-07-27 MED ORDER — SODIUM CHLORIDE 0.9 % IV SOLN: 250.0000 mL | INTRAVENOUS | Status: DC | PRN

## 2020-07-27 MED ORDER — MIDAZOLAM HCL 2 MG/2ML IJ SOLN
INTRAMUSCULAR | Status: DC | PRN
  Administered 2020-07-27: 1 mg via INTRAVENOUS

## 2020-07-27 MED ORDER — SODIUM CHLORIDE 0.9% FLUSH
3.0000 mL | Freq: Two times a day (BID) | INTRAVENOUS | Status: DC
  Administered 2020-07-28 – 2020-07-29 (×2): 3 mL via INTRAVENOUS

## 2020-07-27 MED ORDER — HEPARIN (PORCINE) IN NACL 1000-0.9 UT/500ML-% IV SOLN
INTRAVENOUS | Status: DC | PRN
  Administered 2020-07-27 (×2): 500 mL

## 2020-07-27 MED ORDER — FENTANYL CITRATE (PF) 100 MCG/2ML IJ SOLN
INTRAMUSCULAR | Status: AC
  Filled 2020-07-27: qty 2

## 2020-07-27 MED ORDER — FENTANYL CITRATE (PF) 100 MCG/2ML IJ SOLN
INTRAMUSCULAR | Status: DC | PRN
  Administered 2020-07-27: 50 ug via INTRAVENOUS

## 2020-07-27 MED ORDER — ASPIRIN 81 MG PO CHEW
81.0000 mg | CHEWABLE_TABLET | ORAL | Status: AC
  Administered 2020-07-27: 81 mg via ORAL
  Filled 2020-07-27: qty 1

## 2020-07-27 MED ORDER — HEPARIN (PORCINE) IN NACL 1000-0.9 UT/500ML-% IV SOLN
INTRAVENOUS | Status: AC
  Filled 2020-07-27: qty 1000

## 2020-07-27 MED ORDER — VERAPAMIL HCL 2.5 MG/ML IV SOLN
INTRAVENOUS | Status: DC | PRN
  Administered 2020-07-27: 10 mL via INTRA_ARTERIAL

## 2020-07-27 MED ORDER — LIDOCAINE HCL (PF) 1 % IJ SOLN
INTRAMUSCULAR | Status: DC | PRN
  Administered 2020-07-27 (×2): 2 mL

## 2020-07-27 MED ORDER — HEPARIN SODIUM (PORCINE) 1000 UNIT/ML IJ SOLN
INTRAMUSCULAR | Status: AC
  Filled 2020-07-27: qty 1

## 2020-07-27 SURGICAL SUPPLY — 13 items
CATH 5FR JL3.5 JR4 ANG PIG MP (CATHETERS) ×2 IMPLANT
CATH BALLN WEDGE 5F 110CM (CATHETERS) ×2 IMPLANT
CATH INFINITI 5 FR AR1 MOD (CATHETERS) ×2 IMPLANT
CATH INFINITI 5 FR MPA2 (CATHETERS) ×2 IMPLANT
DEVICE RAD COMP TR BAND LRG (VASCULAR PRODUCTS) ×2 IMPLANT
GLIDESHEATH SLEND SS 6F .021 (SHEATH) ×2 IMPLANT
GUIDEWIRE INQWIRE 1.5J.035X260 (WIRE) ×1 IMPLANT
INQWIRE 1.5J .035X260CM (WIRE) ×2
KIT HEART LEFT (KITS) ×2 IMPLANT
PACK CARDIAC CATHETERIZATION (CUSTOM PROCEDURE TRAY) ×2 IMPLANT
SHEATH GLIDE SLENDER 4/5FR (SHEATH) ×2 IMPLANT
TRANSDUCER W/STOPCOCK (MISCELLANEOUS) ×2 IMPLANT
TUBING CIL FLEX 10 FLL-RA (TUBING) ×2 IMPLANT

## 2020-07-27 NOTE — Progress Notes (Addendum)
Progress Note  Patient Name: Dustin Fowler Date of Encounter: 07/27/2020  [facility] Cardiologist: Parke Poisson, MD   Subjective   Just came back from cath lab. Result pending. Report resolved breathing and LE edema. No chest pain. Was eating lots of salt.   Inpatient Medications    Scheduled Meds: . [START ON 07/28/2020] aspirin EC  81 mg Oral Daily  . atorvastatin  80 mg Oral Daily  . dapagliflozin propanediol  10 mg Oral Daily  . heparin  5,000 Units Subcutaneous Q8H  . hydrALAZINE  50 mg Oral TID  . isosorbide mononitrate  30 mg Oral Daily  . metoprolol tartrate  12.5 mg Oral BID  . sodium chloride flush  3 mL Intravenous Q12H  . sodium chloride flush  3 mL Intravenous Q12H   Continuous Infusions: . sodium chloride    . heparin Stopped (07/27/20 0919)   PRN Meds: sodium chloride, acetaminophen, hydrALAZINE, nitroGLYCERIN, ondansetron (ZOFRAN) IV, sodium chloride flush   Vital Signs    Vitals:   07/27/20 1011 07/27/20 1015 07/27/20 1020 07/27/20 1044  BP: (!) 143/88 (!) 150/80 (!) 144/86 (!) 149/77  Pulse: (!) 108 80 79 82  Resp: 12 (!) 9 12   Temp:    98 F (36.7 C)  TempSrc:    Oral  SpO2: 97% 98% 96% 96%  Weight:      Height:        Intake/Output Summary (Last 24 hours) at 07/27/2020 1052 Last data filed at 07/27/2020 0757 Gross per 24 hour  Intake 677 ml  Output 1300 ml  Net -623 ml   Last 3 Weights 07/27/2020 07/26/2020 07/24/2020  Weight (lbs) 214 lb 4.8 oz 213 lb 14.4 oz 230 lb 6.1 oz  Weight (kg) 97.206 kg 97.024 kg 104.5 kg      Telemetry    SR with PACs, HR 80s - Personally Reviewed  ECG    N.A  Physical Exam   GEN: No acute distress.   Neck: No JVD Cardiac: RRR, no murmurs, rubs, or gallops. Right radial TR band Respiratory: Clear to auscultation bilaterally. GI: Soft, nontender, non-distended  MS: No edema; No deformity. Neuro:  Nonfocal  Psych: Normal affect   Labs    High Sensitivity Troponin:   Recent Labs  Lab  07/24/20 1514 07/24/20 1648  TROPONINIHS 126* 164*      Chemistry Recent Labs  Lab 07/24/20 1324 07/24/20 1339 07/25/20 0809 07/26/20 0439 07/27/20 0543  NA 141   < > 141 141 139  K 3.6   < > 4.1 4.0 3.8  CL 106  --  105 103 104  CO2 25  --  24 28 22   GLUCOSE 112*  --  110* 103* 89  BUN 22*  --  17 20 17   CREATININE 1.52*  --  1.44* 1.69* 1.47*  CALCIUM 8.7*  --  9.2 8.9 8.8*  PROT 6.9  --   --   --   --   ALBUMIN 3.5  --   --   --   --   AST 28  --   --   --   --   ALT 24  --   --   --   --   ALKPHOS 73  --   --   --   --   BILITOT 0.7  --   --   --   --   GFRNONAA 53*  --  57* 47* 55*  ANIONGAP 10  --  12 10 13    < > = values in this interval not displayed.     Hematology Recent Labs  Lab 07/25/20 0352 07/26/20 0439 07/27/20 0222  WBC 6.5 6.1 6.3  RBC 6.15* 6.03* 6.30*  HGB 14.7 14.9 15.0  HCT 48.3 47.1 49.4  MCV 78.5* 78.1* 78.4*  MCH 23.9* 24.7* 23.8*  MCHC 30.4 31.6 30.4  RDW 16.2* 16.1* 16.2*  PLT 235 230 215    BNP Recent Labs  Lab 07/24/20 1325  BNP 330.7*     DDimer  Recent Labs  Lab 07/24/20 1528  DDIMER 3.32*     Radiology    ECHOCARDIOGRAM COMPLETE  Result Date: 07/25/2020    ECHOCARDIOGRAM REPORT   Patient Name:   Dustin Fowler Date of Exam: 07/25/2020 Medical Rec #:  09/22/2020          Height:       69.0 in Accession #:    [id number]         Weight:       230.4 lb Date of Birth:  12-Oct-1960          BSA:          2.194 m Patient Age:    59 years           BP:           169/101 mmHg Patient Gender: M                  HR:           78 bpm. Exam Location:  Inpatient Procedure: 2D Echo, Cardiac Doppler, Color Doppler and Intracardiac            Opacification Agent Indications:    I50.23 Acute on chronic systolic (congestive) heart failure  History:        Patient has no prior history of Echocardiogram examinations.  Sonographer:    05/22/1961 Dance Referring Phys: Elmarie Shiley AMANDA C CONIGLIO IMPRESSIONS  1. Definity contrast given but image  quality remains suboptimal. Apex appears akinetic. There may also be anteroseptal hypokinesis, and possible basal septal thinning. Consider coronary angiogram and/or cardiac MRI as part of evaluation to further assess cardiomyopathy.  2. Left ventricular ejection fraction, by estimation, is 35 to 40%. The left ventricle has moderately decreased function. Left ventricular endocardial border not optimally defined to evaluate regional wall motion. There is severe asymmetric left ventricular hypertrophy of the posterior-lateral segment. Left ventricular diastolic parameters are consistent with Grade III diastolic dysfunction (restrictive). Elevated left ventricular end-diastolic pressure.  3. Right ventricular systolic function is moderately reduced. The right ventricular size is normal.  4. Left atrial size was severely dilated.  5. The mitral valve is normal in structure. Mild mitral valve regurgitation. No evidence of mitral stenosis.  6. The aortic valve is normal in structure. Aortic valve regurgitation is not visualized. No aortic stenosis is present.  7. The inferior vena cava is dilated in size with >50% respiratory variability, suggesting right atrial pressure of 8 mmHg. FINDINGS  Left Ventricle: Left ventricular ejection fraction, by estimation, is 35 to 40%. The left ventricle has moderately decreased function. Left ventricular endocardial border not optimally defined to evaluate regional wall motion. Definity contrast agent was given IV to delineate the left ventricular endocardial borders. The left ventricular internal cavity size was normal in size. There is severe asymmetric left ventricular hypertrophy of the posterior-lateral segment. Left ventricular diastolic parameters are consistent with Grade III diastolic dysfunction (restrictive). Elevated left ventricular  end-diastolic pressure.  LV Wall Scoring: The apical lateral segment, apical septal segment, apical anterior segment, and apical inferior  segment are akinetic. Right Ventricle: The right ventricular size is normal. No increase in right ventricular wall thickness. Right ventricular systolic function is moderately reduced. Left Atrium: Left atrial size was severely dilated. Right Atrium: Right atrial size was normal in size. Pericardium: There is no evidence of pericardial effusion. Mitral Valve: The mitral valve is normal in structure. Mild mitral valve regurgitation. No evidence of mitral valve stenosis. Tricuspid Valve: The tricuspid valve is normal in structure. Tricuspid valve regurgitation is trivial. No evidence of tricuspid stenosis. Aortic Valve: The aortic valve is normal in structure. Aortic valve regurgitation is not visualized. No aortic stenosis is present. Pulmonic Valve: The pulmonic valve was normal in structure. Pulmonic valve regurgitation is trivial. No evidence of pulmonic stenosis. Aorta: The aortic root is normal in size and structure. Venous: The inferior vena cava is dilated in size with greater than 50% respiratory variability, suggesting right atrial pressure of 8 mmHg. IAS/Shunts: No atrial level shunt detected by color flow Doppler.  LEFT VENTRICLE PLAX 2D LVIDd:         5.40 cm  Diastology LVIDs:         4.10 cm  LV e' medial:    3.98 cm/s LV PW:         2.30 cm  LV E/e' medial:  28.9 LV IVS:        1.50 cm  LV e' lateral:   5.63 cm/s LVOT diam:     2.10 cm  LV E/e' lateral: 20.4 LV SV:         42 LV SV Index:   19 LVOT Area:     3.46 cm  RIGHT VENTRICLE            IVC RV Basal diam:  2.90 cm    IVC diam: 2.60 cm RV S prime:     8.10 cm/s TAPSE (M-mode): 1.7 cm LEFT ATRIUM              Index       RIGHT ATRIUM           Index LA diam:        5.20 cm  2.37 cm/m  RA Area:     17.70 cm LA Vol (A2C):   121.0 ml 55.15 ml/m RA Volume:   52.70 ml  24.02 ml/m LA Vol (A4C):   137.0 ml 62.44 ml/m LA Biplane Vol: 131.0 ml 59.71 ml/m  AORTIC VALVE LVOT Vmax:   84.40 cm/s LVOT Vmean:  52.650 cm/s LVOT VTI:    0.121 m  AORTA Ao  Root diam: 3.50 cm Ao Asc diam:  3.20 cm MITRAL VALVE MV Area (PHT): 4.49 cm     SHUNTS MV Decel Time: 169 msec     Systemic VTI:  0.12 m MV E velocity: 115.00 cm/s  Systemic Diam: 2.10 cm MV A velocity: 52.10 cm/s MV E/A ratio:  2.21 Weston Brass MD Electronically signed by Weston Brass MD Signature Date/Time: 07/25/2020/1:56:12 PM    Final     Cardiac Studies   Echo 07/25/20 1. Definity contrast given but image quality remains suboptimal. Apex  appears akinetic. There may also be anteroseptal hypokinesis, and possible  basal septal thinning. Consider coronary angiogram and/or cardiac MRI as  part of evaluation to further  assess cardiomyopathy.  2. Left ventricular ejection fraction, by estimation, is 35 to 40%. The  left ventricle  has moderately decreased function. Left ventricular  endocardial border not optimally defined to evaluate regional wall motion.  There is severe asymmetric left  ventricular hypertrophy of the posterior-lateral segment. Left ventricular  diastolic parameters are consistent with Grade III diastolic dysfunction  (restrictive). Elevated left ventricular end-diastolic pressure.  3. Right ventricular systolic function is moderately reduced. The right  ventricular size is normal.  4. Left atrial size was severely dilated.  5. The mitral valve is normal in structure. Mild mitral valve  regurgitation. No evidence of mitral stenosis.  6. The aortic valve is normal in structure. Aortic valve regurgitation is  not visualized. No aortic stenosis is present.  7. The inferior vena cava is dilated in size with >50% respiratory  variability, suggesting right atrial pressure of 8 mmHg.   Patient Profile     60 y.o. male with tobacco use and untreated HTNpresents with hypertensive urgency and shortness of breath with exertion and orthopnea, now found to have acute systolic HF and hypertensive urgency.  Assessment & Plan    1. Acute combined CHF - Echo showed  LVEF of 35-40%, grade II DD, WM abnormality as above and elevated LVEDP.  - Given IV lasix 40mg  x 2 on 07/25/20. Diuresed -53cc. Weight down 230>>214lb.  - R & L cath done. Pending result. - Appears euvolemic with resolved dyspnea and LE edema - Consolidate metoprolol to TOPROL XL or Coreg based on cath result - Continue Imdur/Hydralzine >> change on Entresto (will get cost) vs ARB based on renal function  - Continue 09/22/20 (will get cost)  - Discussed low salt diet in detail  - HgbA1c 5.9  2. Hypertensive urgency - BP improving on current medications  3. Tobacco smoking - Cessation recommended   4. HLD - 07/25/2020: Cholesterol 260; HDL 80; LDL Cholesterol 169; Triglycerides 53; VLDL 11 - On statin   5. AKI with resumed CKD III -- SCR 1.52>>1.44>>1.69>>1.47 - Follow closely post cath   6. Elevated troponin  - Hs-troponin 126>>164 - Treated with heparin - pending cath result   For questions or updates, please contact [facility] Please consult [email or web address] for contact info under         Signed, 09/22/2020, PA  07/27/2020, 10:52 AM    Personally seen and examined. Agree with above.   Cardiac catheterization reviewed.  Cardiomyopathy is out of proportion to level of coronary artery disease.  We will treat his coronary artery disease medically.  Agree with change to carvedilol.  Entresto would be perfect.  Trying to see availability for him.  Social determinants of health. He currently is feeling well, in bed.  09/24/2020, MD

## 2020-07-27 NOTE — Progress Notes (Signed)
ANTICOAGULATION CONSULT NOTE  Pharmacy Consult for heparin Indication: chest pain/ACS  No Known Allergies  Patient Measurements: Height: 5\' 9"  (175.3 cm) Weight: 97.2 kg (214 lb 4.8 oz) IBW/kg (Calculated) : 70.7 Heparin Dosing Weight: 93.2kg  Vital Signs: Temp: 97.9 F (36.6 C) (02/07 0531) Temp Source: Oral (02/07 0531) BP: 149/120 (02/07 0823) Pulse Rate: 97 (02/07 0823)  Labs: Recent Labs    07/24/20 1514 07/24/20 1648 07/25/20 0352 07/25/20 0809 07/25/20 1216 07/25/20 1852 07/26/20 0439 07/27/20 0222 07/27/20 0543  HGB  --   --  14.7  --   --   --  14.9 15.0  --   HCT  --   --  48.3  --   --   --  47.1 49.4  --   PLT  --   --  235  --   --   --  230 215  --   HEPARINUNFRC  --   --  <0.10*  --    < > 0.32 0.43 0.49  --   CREATININE  --   --   --  1.44*  --   --  1.69*  --  1.47*  TROPONINIHS 126* 164*  --   --   --   --   --   --   --    < > = values in this interval not displayed.    Estimated Creatinine Clearance: 63.8 mL/min (A) (by C-G formula based on SCr of 1.47 mg/dL (H)).  Assessment: 63 YOM presenting with UR sx and SOB, troponin elevated.  Not on anticoagulation PTA, CBC wnl.  Plans for cath today  Heparin level therapeutic at 0.49 on rate of 1,700 units/hr.   Goal of Therapy:  Heparin level 0.3-0.7 units/ml Monitor platelets by anticoagulation protocol: Yes   Plan:  Continue heparin gtt at 1,700 units/hr Daily heparin level and cbc  60, PharmD Clinical Pharmacist **Pharmacist phone directory can now be found on [email or web address] ([email or web address]).  Listed under [facility].

## 2020-07-27 NOTE — Progress Notes (Addendum)
Heart Failure Nurse Navigator Progress Note  PCP: No primary care provider on file. PCP-Cardiologist: Elspeth Cho MD (new) Admission Diagnosis: NSTEMI Admitted from: home alone  Presentation:   Dustin Fowler presented with HTN urgency, SOB. R/L heart cath 07/27/20, medically treat.  ECHO/ LVEF: 07/25/20 35-40%  Clinical Course:  History reviewed. No pertinent past medical history.   Social History   Socioeconomic History  . Marital status: Single    Spouse name: Not on file  . Number of children: Not on file  . Years of education: Not on file  . Highest education level: Not on file  Occupational History  . Not on file  Tobacco Use  . Smoking status: Former Smoker    Packs/day: 1.50    Years: 40.00    Pack years: 60.00    Types: Cigarettes    Quit date: 07/24/2020  . Smokeless tobacco: Never Used  . Tobacco comment: willing to stop.   Vaping Use  . Vaping Use: Never used  Substance and Sexual Activity  . Alcohol use: Yes    Alcohol/week: 4.0 standard drinks    Types: 4 Cans of beer per week    Comment: 1 40oz can a day, everyday.  . Drug use: Not Currently  . Sexual activity: Not on file  Other Topics Concern  . Not on file  Social History Narrative  . Not on file   Social Determinants of Health   Financial Resource Strain: High Risk  . Difficulty of Paying Living Expenses: Hard  Food Insecurity: Food Insecurity Present  . Worried About Programme researcher, broadcasting/film/video in the Last Year: Sometimes true  . Ran Out of Food in the Last Year: Sometimes true  Transportation Needs: Unmet Transportation Needs  . Lack of Transportation (Medical): Yes  . Lack of Transportation (Non-Medical): Yes  Physical Activity: Insufficiently Active  . Days of Exercise per Week: 4 days  . Minutes of Exercise per Session: 30 min  Stress: Not on file  Social Connections: Not on file   High Risk Criteria for Readmission and/or Poor Patient Outcomes:  Heart failure hospital admissions (last  6 months): 1   No Show rate: 0%  Difficult social situation: yes, see SDOH  Demonstrates medication adherence: pt had no medications prior to admission.  Primary Language: English  Literacy level: able to read/write and comprehend Education Assessment and Provision:  Detailed education and instructions provided on heart failure disease management including the following:  Signs and symptoms of Heart Failure When to call the physician Importance of daily weights Low sodium diet Fluid restriction Medication management Anticipated future follow-up appointments  Patient education given on each of the above topics.  Patient acknowledges understanding via teach back method and acceptance of all instructions.  Education Materials:  "Living Better With Heart Failure" Booklet, HF zone tool, & Daily Weight Tracker Tool.  Patient has scale at home: no, given from AHF clinic. Patient has pill box at home: no, given from AHF clinic.    Barriers of Care:   -new diagnoses  -no PCP -no insurance  Considerations/Referrals:   Referral made to Heart Failure Pharmacist Stewardship: yes, appreciated Referral made to Heart Impact TOC clinic: yes, appt 2/9 @ 1PM  Items for Follow-up on DC/TOC: -insurance -medication cost -medication compliance -HF education (new dx) -PCP -smoking/drinking cessation  -transportationNutritional therapist up [employer]. Please sign rider release form at [facility] appt. -food insecurity  Ozella Rocks, RN, BSN Heart Failure TEFL teacher Team [phone]

## 2020-07-27 NOTE — Progress Notes (Signed)
Heart Failure Stewardship Pharmacist Progress Note   PCP: No primary care provider on file. PCP-Cardiologist: Parke Poisson, MD    HPI:  60 yo M with PMH of tobacco use and untreated HTN. He presented to the ED on 07/24/20 with HTN urgency and shortness of breath. An ECHO was done on 07/25/20 and LVEF is reduced to 35-40%. 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   Current HF Medications: Carvedilol 6.25 mg BID Entresto 24/26 mg BID Farxiga 10 mg daily Hydralazine 50 mg TID Imdur 30 mg daily  Prior to admission HF Medications: None  Pertinent Lab Values: . Serum creatinine 1.47, BUN 17, Potassium 3.8, Sodium 139, BNP 330.7  Vital Signs: . Weight: 214 lbs (admission weight: 213 lbs) . Blood pressure: 160/80s  . Heart rate: 80s   Medication Assistance / Insurance Benefits Check: Does the patient have prescription insurance?  No  Does the patient qualify for medication assistance through manufacturers or grants?   Yes . Eligible grants and/or patient assistance programs: [employer] . Medication assistance applications in progress: [employer]  . Medication assistance applications approved: none Approved medication assistance renewals will be completed by: [facility]  Outpatient Pharmacy:  Prior to admission outpatient pharmacy: [facility] Is the patient willing to use [facility] TOC pharmacy at discharge? Yes Is the patient willing to transition their outpatient pharmacy to utilize a [facility] outpatient pharmacy?   No    Assessment: 1. Acute systolic CHF (EF 23-55%), due to NICM. NYHA class II symptoms. - Agree with starting carvedilol 6.25 mg BID - Agree with starting Entresto 24/26 mg BID - Continue Farxiga 10 mg daily - Continue hydralazine 50 mg TID - Continue Imdur 30 mg daily   Plan: 1) Medication changes  recommended at this time: - Pending BP trends after starting Entresto and carvedilol today, may be able to increase Entresto to 49/51 mg BID prior to discharge  2) Patient assistance application(s): - Sherryll Burger and [place] applications initiated. Will hold off submitting until stable dose of Entresto for discharge is determined. - He can use the free 30-day cards for both Sherryll Burger and Farxiga with [facility] pharmacy - Will consult case manager for [facility]  3)  Education  - To be completed prior to discharge - Appreciate HF Navigator's assistance  Sharen Hones, PharmD, BCPS Heart Failure Stewardship Pharmacist Phone [phone]

## 2020-07-27 NOTE — Interval H&P Note (Signed)
History and Physical Interval Note:  07/27/2020 9:28 AM  Dustin Fowler  has presented today for surgery, with the diagnosis of acute systolic heart failure and NSTEMI.  The various methods of treatment have been discussed with the patient and family. After consideration of risks, benefits and other options for treatment, the patient has consented to  Procedure(s): RIGHT/LEFT HEART CATH AND CORONARY ANGIOGRAPHY (N/A) as a surgical intervention.  The patient's history has been reviewed, patient examined, no change in status, stable for surgery.  I have reviewed the patient's chart and labs.  Questions were answered to the patient's satisfaction.    Cath Lab Visit (complete for each Cath Lab visit)  Clinical Evaluation Leading to the Procedure:   ACS: Yes.    Non-ACS:  N/A  Sarvesh Meddaugh

## 2020-07-28 LAB — CBC
HCT: 48 % (ref 39.0–52.0)
Hemoglobin: 15.6 g/dL (ref 13.0–17.0)
MCH: 24.5 pg — ABNORMAL LOW (ref 26.0–34.0)
MCHC: 32.5 g/dL (ref 30.0–36.0)
MCV: 75.4 fL — ABNORMAL LOW (ref 80.0–100.0)
Platelets: 240 10*3/uL (ref 150–400)
RBC: 6.37 MIL/uL — ABNORMAL HIGH (ref 4.22–5.81)
RDW: 16.5 % — ABNORMAL HIGH (ref 11.5–15.5)
WBC: 5 10*3/uL (ref 4.0–10.5)
nRBC: 0 % (ref 0.0–0.2)

## 2020-07-28 MED ORDER — SACUBITRIL-VALSARTAN 49-51 MG PO TABS
1.0000 | ORAL_TABLET | Freq: Two times a day (BID) | ORAL | Status: DC
  Administered 2020-07-28 (×2): 1 via ORAL
  Filled 2020-07-28 (×3): qty 1

## 2020-07-28 MED ORDER — SPIRONOLACTONE 12.5 MG HALF TABLET
12.5000 mg | ORAL_TABLET | Freq: Every day | ORAL | Status: DC
  Administered 2020-07-28 – 2020-07-29 (×2): 12.5 mg via ORAL
  Filled 2020-07-28 (×2): qty 1

## 2020-07-28 MED ORDER — NICOTINE 14 MG/24HR TD PT24
14.0000 mg | MEDICATED_PATCH | Freq: Every day | TRANSDERMAL | Status: DC
  Administered 2020-07-28: 14 mg via TRANSDERMAL
  Filled 2020-07-28 (×2): qty 1

## 2020-07-28 NOTE — Progress Notes (Signed)
Progress Note  Patient Name: Dustin Fowler Date of Encounter: 07/28/2020  [facility] Cardiologist: Parke Poisson, MD   Subjective   Feeling better, less short of breath.  Inpatient Medications    Scheduled Meds: . aspirin EC  81 mg Oral Daily  . atorvastatin  80 mg Oral Daily  . carvedilol  6.25 mg Oral BID WC  . dapagliflozin propanediol  10 mg Oral Daily  . heparin  5,000 Units Subcutaneous Q8H  . hydrALAZINE  50 mg Oral TID  . isosorbide mononitrate  30 mg Oral Daily  . sacubitril-valsartan  1 tablet Oral BID  . sodium chloride flush  3 mL Intravenous Q12H  . sodium chloride flush  3 mL Intravenous Q12H   Continuous Infusions: . sodium chloride     PRN Meds: sodium chloride, acetaminophen, nitroGLYCERIN, ondansetron (ZOFRAN) IV, sodium chloride flush   Vital Signs    Vitals:   07/27/20 1200 07/27/20 1516 07/27/20 1954 07/28/20 0359  BP: (!) 150/79 (!) 167/89 (!) 157/96 (!) 143/93  Pulse: 65 81 78 84  Resp:   18 14  Temp:   98.2 F (36.8 C) 98.1 F (36.7 C)  TempSrc:   Oral Oral  SpO2: 98%  96% 97%  Weight:    96.2 kg  Height:        Intake/Output Summary (Last 24 hours) at 07/28/2020 0846 Last data filed at 07/28/2020 1856 Gross per 24 hour  Intake 1432 ml  Output 2025 ml  Net -593 ml   Last 3 Weights 07/28/2020 07/27/2020 07/26/2020  Weight (lbs) 212 lb 1.6 oz 214 lb 4.8 oz 213 lb 14.4 oz  Weight (kg) 96.208 kg 97.206 kg 97.024 kg      Telemetry    No adverse arrhythmias- Personally Reviewed  ECG    No new- Personally Reviewed  Physical Exam   GEN: No acute distress.   Neck: No JVD Cardiac: RRR, no murmurs, rubs, or gallops.  Respiratory: Clear to auscultation bilaterally. GI: Soft, nontender, non-distended  MS: No edema; No deformity. Neuro:  Nonfocal  Psych: Normal affect   Labs    High Sensitivity Troponin:   Recent Labs  Lab 07/24/20 1514 07/24/20 1648  TROPONINIHS 126* 164*      Chemistry Recent Labs  Lab  07/24/20 1324 07/24/20 1339 07/25/20 0809 07/26/20 0439 07/27/20 0543 07/27/20 0954 07/27/20 0956  NA 141   < > 141 141 139 141 141  K 3.6   < > 4.1 4.0 3.8 3.9 3.7  CL 106  --  105 103 104  --   --   CO2 25  --  24 28 22   --   --   GLUCOSE 112*  --  110* 103* 89  --   --   BUN 22*  --  17 20 17   --   --   CREATININE 1.52*  --  1.44* 1.69* 1.47*  --   --   CALCIUM 8.7*  --  9.2 8.9 8.8*  --   --   PROT 6.9  --   --   --   --   --   --   ALBUMIN 3.5  --   --   --   --   --   --   AST 28  --   --   --   --   --   --   ALT 24  --   --   --   --   --   --  ALKPHOS 73  --   --   --   --   --   --   BILITOT 0.7  --   --   --   --   --   --   GFRNONAA 53*  --  57* 47* 55*  --   --   ANIONGAP 10  --  12 10 13   --   --    < > = values in this interval not displayed.     Hematology Recent Labs  Lab 07/26/20 0439 07/27/20 0222 07/27/20 0954 07/27/20 0956 07/28/20 0334  WBC 6.1 6.3  --   --  5.0  RBC 6.03* 6.30*  --   --  6.37*  HGB 14.9 15.0 16.0 15.6 15.6  HCT 47.1 49.4 47.0 46.0 48.0  MCV 78.1* 78.4*  --   --  75.4*  MCH 24.7* 23.8*  --   --  24.5*  MCHC 31.6 30.4  --   --  32.5  RDW 16.1* 16.2*  --   --  16.5*  PLT 230 215  --   --  240    BNP Recent Labs  Lab 07/24/20 1325  BNP 330.7*     DDimer  Recent Labs  Lab 07/24/20 1528  DDIMER 3.32*     Radiology    CARDIAC CATHETERIZATION  Result Date: 07/27/2020 Conclusions: 1. Severe single vessel coronary artery disease with chronic total/subtotal occlusion of the distal RCA/rPLA with bridging collaterals.  Mild to moderate, non-obstructive CAD noted in the left coronary artery. 2. Mildly elevated left and right heart filling pressures. 3. Normal Fick cardiac output/index. Recommendations: 1. Continue medical therapy of acute systolic heart failure.  09/24/2020 this representing mostly non-ischemic cardiomyopathy as degree of LV dysfunction is out of proportion to coronary artery disease. 2. Aggressive secondary  prevention of CAD. Legrand Rams, MD [facility]     Cardiac Studies   Conclusions: 1. Severe single vessel coronary artery disease with chronic total/subtotal occlusion of the distal RCA/rPLA with bridging collaterals.  Mild to moderate, non-obstructive CAD noted in the left coronary artery. 2. Mildly elevated left and right heart filling pressures. 3. Normal Fick cardiac output/index.  Recommendations: 1. Continue medical therapy of acute systolic heart failure.  [facility] this representing mostly non-ischemic cardiomyopathy as degree of LV dysfunction is out of proportion to coronary artery disease. 2. Aggressive secondary prevention of CAD.  Diagnostic Dominance: Right    ECHO this admit  Patient Profile     60 y.o. male  with tobacco use and untreated HTNpresents with hypertensive urgency and shortness of breath with exertion and orthopnea, now found to have acute systolic HF and hypertensive urgency.  Assessment & Plan    Acute systolic heart failure both ischemic/nonischemic -Echo EF 35 to 40% -Added Entresto 24/26, will increase to 49/51 -On Coreg 6.25. -Hydralazine 50 3 times daily, isosorbide 30 a day. -I will add spironolactone 12.5 mg a day.  Last creatinine was 1.47 with potassium of 3.7 -2 L out yesterday  Hypertensive urgency -Improved with medications as above.  Hyperlipidemia -Continue with statin therapy.  AKI with CKD 3A -Creatinine 1.47  Demand ischemia in the setting of acute systolic heart failure.  Hopeful plan on discharge tomorrow.  He is eager to go home.  He will need a work note.  For questions or updates, please contact [facility] Please consult [email or web address] for contact info under        Signed, 60, MD  07/28/2020, 8:46 AM

## 2020-07-28 NOTE — Progress Notes (Signed)
Heart Failure Stewardship Pharmacist Progress Note   PCP: No primary care provider on file. PCP-Cardiologist: Parke Poisson, MD    HPI:  60 yo M with PMH of tobacco use and untreated HTN. He presented to the ED on 07/24/20 with HTN urgency and shortness of breath. An ECHO was done on 07/25/20 and LVEF is reduced to 35-40%. 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   Current HF Medications: Carvedilol 6.25 mg BID Entresto 49/51 mg BID Farxiga 10 mg daily Spironolactone 12.5 mg daily Hydralazine 50 mg TID Imdur 30 mg daily  Prior to admission HF Medications: None  Pertinent Lab Values (as of 07/27/20): Marland Kitchen Serum creatinine 1.47, BUN 17, Potassium 3.8, Sodium 139, BNP 330.7  Vital Signs: . Weight: 212 lbs (admission weight: 213 lbs) . Blood pressure: 140-160/90s  . Heart rate: 80s   Medication Assistance / Insurance Benefits Check: Does the patient have prescription insurance?  No  Does the patient qualify for medication assistance through manufacturers or grants?   Yes . Eligible grants and/or patient assistance programs: [employer] . Medication assistance applications in progress: [employer]  . Medication assistance applications approved: none Approved medication assistance renewals will be completed by: [facility]  Outpatient Pharmacy:  Prior to admission outpatient pharmacy: [facility] Is the patient willing to use [facility] TOC pharmacy at discharge? Yes Is the patient willing to transition their outpatient pharmacy to utilize a [facility] outpatient pharmacy?   No - does not have a card to put on file, [facility]    Assessment: 1. Acute systolic CHF (EF 06-23%), due to NICM. NYHA class II symptoms. - Continue carvedilol 6.25 mg BID - Agree with increasing Entresto to 49/51 mg BID -  Continue Farxiga 10 mg daily - Agree with starting spironolactone 12.5 mg daily - Continue hydralazine 50 mg TID - Continue Imdur 30 mg daily   Plan: 1) Medication changes recommended at this time: - Agree with medication changes noted above  2) Patient assistance application(s): - Sherryll Burger and [place] applications initiated - He can use the free 30-day cards for both Entresto and Farxiga with [facility] pharmacy - Will consult case manager for [facility]  3)  Education  - To be completed prior to discharge - Appreciate HF Navigator's assistance  Sharen Hones, PharmD, BCPS Heart Failure Engineer, building services Phone [phone]

## 2020-07-28 NOTE — Progress Notes (Signed)
Pt requesting for a Drs note about returning to work. Addressed on day shift nurse.

## 2020-07-28 NOTE — Progress Notes (Signed)
[facility]ed [facility] TOC appointment to Friday at 10AM as patient is still hospitalized, with potential to DC 2/9. Adjusted [facility] ride to Friday. Pt given appt card and [facility] phone number to schedule for future appointments.   Items for Follow-up on DC/TOC: -insurance -medication cost -medication compliance -HF education (new dx) -PCP -smoking/drinking cessation  -transportationNutritional therapist up [employer]. Please sign rider release form at [facility] appt. -food insecurity  Ozella Rocks, RN, BSN Heart Failure TEFL teacher Team [phone]

## 2020-07-29 ENCOUNTER — Encounter (HOSPITAL_COMMUNITY): Payer: Self-pay

## 2020-07-29 ENCOUNTER — Other Ambulatory Visit: Payer: Self-pay | Admitting: Physician Assistant

## 2020-07-29 ENCOUNTER — Telehealth: Payer: Self-pay | Admitting: Internal Medicine

## 2020-07-29 LAB — BASIC METABOLIC PANEL
Anion gap: 10 (ref 5–15)
BUN: 15 mg/dL (ref 6–20)
CO2: 24 mmol/L (ref 22–32)
Calcium: 9.2 mg/dL (ref 8.9–10.3)
Chloride: 106 mmol/L (ref 98–111)
Creatinine, Ser: 1.46 mg/dL — ABNORMAL HIGH (ref 0.61–1.24)
GFR, Estimated: 56 mL/min — ABNORMAL LOW (ref >60)
Glucose, Bld: 99 mg/dL (ref 70–99)
Potassium: 4 mmol/L (ref 3.5–5.1)
Sodium: 140 mmol/L (ref 135–145)

## 2020-07-29 MED ORDER — ASPIRIN 81 MG PO TBEC: 81.0000 mg | DELAYED_RELEASE_TABLET | Freq: Every day | ORAL | 11 refills | Status: DC

## 2020-07-29 MED ORDER — HYDRALAZINE HCL 50 MG PO TABS: 50.0000 mg | ORAL_TABLET | Freq: Three times a day (TID) | ORAL | 6 refills | Status: DC

## 2020-07-29 MED ORDER — NITROGLYCERIN 0.4 MG SL SUBL: 0.4000 mg | SUBLINGUAL_TABLET | SUBLINGUAL | 12 refills | Status: DC | PRN

## 2020-07-29 MED ORDER — ATORVASTATIN CALCIUM 80 MG PO TABS: 80.0000 mg | ORAL_TABLET | Freq: Every day | ORAL | 6 refills | Status: DC

## 2020-07-29 MED ORDER — DAPAGLIFLOZIN PROPANEDIOL 10 MG PO TABS: 10.0000 mg | ORAL_TABLET | Freq: Every day | ORAL | 6 refills | Status: DC

## 2020-07-29 MED ORDER — CARVEDILOL 6.25 MG PO TABS: 6.2500 mg | ORAL_TABLET | Freq: Two times a day (BID) | ORAL | 6 refills | Status: DC

## 2020-07-29 MED ORDER — SACUBITRIL-VALSARTAN 97-103 MG PO TABS: 1.0000 | ORAL_TABLET | Freq: Two times a day (BID) | ORAL | 11 refills | Status: DC

## 2020-07-29 MED ORDER — SACUBITRIL-VALSARTAN 97-103 MG PO TABS
1.0000 | ORAL_TABLET | Freq: Two times a day (BID) | ORAL | Status: DC
  Administered 2020-07-29: 1 via ORAL
  Filled 2020-07-29 (×2): qty 1

## 2020-07-29 MED ORDER — FUROSEMIDE 40 MG PO TABS: 40.0000 mg | ORAL_TABLET | ORAL | 3 refills | Status: DC | PRN

## 2020-07-29 MED ORDER — ISOSORBIDE MONONITRATE ER 30 MG PO TB24: 30.0000 mg | ORAL_TABLET | Freq: Every day | ORAL | 6 refills | Status: DC

## 2020-07-29 MED ORDER — SPIRONOLACTONE 25 MG PO TABS: 12.5000 mg | ORAL_TABLET | Freq: Every day | ORAL | 6 refills | Status: DC

## 2020-07-29 MED FILL — ENTRESTO 97 MG-103 MG TAB: 97-103 | 30 days supply | Qty: 60 | Fill #0

## 2020-07-29 MED FILL — ISOSORBIDE MN ER 30 MG TAB: 30 | 30 days supply | Qty: 30 | Fill #0

## 2020-07-29 MED FILL — FARXIGA 10 MG TABLET: 10 | 30 days supply | Qty: 30 | Fill #0

## 2020-07-29 MED FILL — SPIRONOLACTONE 25 MG TABLET: 25 | 30 days supply | Qty: 15 | Fill #0

## 2020-07-29 MED FILL — ATORVASTATIN CALCIUM 80 MG: 80 | 30 days supply | Qty: 30 | Fill #0

## 2020-07-29 MED FILL — ASPIRIN LOW DOSE 81 MG TBEC: 81 | 30 days supply | Qty: 30 | Fill #0

## 2020-07-29 MED FILL — CARVEDILOL 6.25 MG TABLET: 6.25 | 30 days supply | Qty: 60 | Fill #0

## 2020-07-29 MED FILL — hydrALAZINE HCL 50 MG TABS: 50 | 30 days supply | Qty: 90 | Fill #0

## 2020-07-29 MED FILL — NITROGLYCERIN 0.4 MG TAB SL: 0.4 | 8 days supply | Qty: 25 | Fill #0

## 2020-07-29 MED FILL — FUROSEMIDE 40 MG TABLET: 40 | 30 days supply | Qty: 30 | Fill #0

## 2020-07-29 NOTE — TOC Transition Note (Signed)
Transition of Care [facility]) - CM/SW Discharge Note   Patient Details  Name: Dustin Fowler MRN: [id number] Date of Birth: [birth year]  Transition of Care [facility]) CM/SW Contact:  Leone Haven, RN Phone Number: 07/29/2020, 11:54 AM   Clinical Narrative:    Patient is for dc today, he has follow up apt at the [facility] clinic, Remonia Richter Pharmacist has initaitated the patient ast for entresto and farxiga for patient.  She states he will have the meds in two weeks before he runs out of the 30 day free. NCM gave him the brochure for the [facility] clinic. TOC filling meds, NCM assisted with [employer], also. He states he has a bus pass and he will be taking the bus home.    Final next level of care: Home/Self Care Barriers to Discharge: No Barriers Identified   Patient Goals and CMS Choice Patient states their goals for this hospitalization and ongoing recovery are:: get better   Choice offered to / list presented to : NA  Discharge Placement                       Discharge Plan and Services                  DME Agency: NA       HH Arranged: NA          Social Determinants of Health (SDOH) Interventions Food Insecurity Interventions: Other (Comment) (consulted inpt SW to assist with food stamp elgibility) Financial Strain Interventions: Artist (consulted inpt SW to assist with food stamp elgibility, no insurace-SW+financial counselors, medication assistance- HF pharmacist assisting.) Housing Interventions: Intervention Not Indicated Physical Activity Interventions: Intervention Not Indicated Transportation Interventions: Retail banker Alcohol Brief Interventions/Follow-up: AUDIT Score <7 follow-up not indicated   Readmission Risk Interventions No flowsheet data found.

## 2020-07-29 NOTE — Telephone Encounter (Signed)
Patient currently admitted

## 2020-07-29 NOTE — Discharge Instructions (Signed)
DASH Eating Plan DASH stands for Dietary Approaches to Stop Hypertension. The DASH eating plan is a healthy eating plan that has been shown to:  Reduce high blood pressure (hypertension).  Reduce your risk for type 2 diabetes, heart disease, and stroke.  Help with weight loss. What are tips for following this plan? Reading food labels  Check food labels for the amount of salt (sodium) per serving. Choose foods with less than 5 percent of the Daily Value of sodium. Generally, foods with less than 300 milligrams (mg) of sodium per serving fit into this eating plan.  To find whole grains, look for the word "whole" as the first word in the ingredient list. Shopping  Buy products labeled as "low-sodium" or "no salt added."  Buy fresh foods. Avoid canned foods and pre-made or frozen meals. Cooking  Avoid adding salt when cooking. Use salt-free seasonings or herbs instead of table salt or sea salt. Check with your health care provider or pharmacist before using salt substitutes.  Do not fry foods. Cook foods using healthy methods such as baking, boiling, grilling, roasting, and broiling instead.  Cook with heart-healthy oils, such as olive, canola, avocado, soybean, or sunflower oil. Meal planning  Eat a balanced diet that includes: ? 4 or more servings of fruits and 4 or more servings of vegetables each day. Try to fill one-half of your plate with fruits and vegetables. ? 6-8 servings of whole grains each day. ? Less than 6 oz (170 g) of lean meat, poultry, or fish each day. A 3-oz (85-g) serving of meat is about the same size as a deck of cards. One egg equals 1 oz (28 g). ? 2-3 servings of low-fat dairy each day. One serving is 1 cup (237 mL). ? 1 serving of nuts, seeds, or beans 5 times each week. ? 2-3 servings of heart-healthy fats. Healthy fats called omega-3 fatty acids are found in foods such as walnuts, flaxseeds, fortified milks, and eggs. These fats are also found in  cold-water fish, such as sardines, salmon, and mackerel.  Limit how much you eat of: ? Canned or prepackaged foods. ? Food that is high in trans fat, such as some fried foods. ? Food that is high in saturated fat, such as fatty meat. ? Desserts and other sweets, sugary drinks, and other foods with added sugar. ? Full-fat dairy products.  Do not salt foods before eating.  Do not eat more than 4 egg yolks a week.  Try to eat at least 2 vegetarian meals a week.  Eat more home-cooked food and less restaurant, buffet, and fast food.   Lifestyle  When eating at a restaurant, ask that your food be prepared with less salt or no salt, if possible.  If you drink alcohol: ? Limit how much you use to:  0-1 drink a day for women who are not pregnant.  0-2 drinks a day for men. ? Be aware of how much alcohol is in your drink. In the [place] one drink equals one 12 oz bottle of beer (355 mL), one 5 oz glass of wine (148 mL), or one 1 oz glass of hard liquor (44 mL). General information  Avoid eating more than 2,300 mg of salt a day. If you have hypertension, you may need to reduce your sodium intake to 1,500 mg a day.  Work with your health care provider to maintain a healthy body weight or to lose weight. Ask what an ideal weight is for you.    Get at least 30 minutes of exercise that causes your heart to beat faster (aerobic exercise) most days of the week. Activities may include walking, swimming, or biking.  Work with your health care provider or dietitian to adjust your eating plan to your individual calorie needs. What foods should I eat? Fruits All fresh, dried, or frozen fruit. Canned fruit in natural juice (without added sugar). Vegetables Fresh or frozen vegetables (raw, steamed, roasted, or grilled). Low-sodium or reduced-sodium tomato and vegetable juice. Low-sodium or reduced-sodium tomato sauce and tomato paste. Low-sodium or reduced-sodium canned vegetables. Grains Whole-grain  or whole-wheat bread. Whole-grain or whole-wheat pasta. Brown rice. Oatmeal. Quinoa. Bulgur. Whole-grain and low-sodium cereals. Pita bread. Low-fat, low-sodium crackers. Whole-wheat flour tortillas. Meats and other proteins Skinless chicken or turkey. Ground chicken or turkey. Pork with fat trimmed off. Fish and seafood. Egg whites. Dried beans, peas, or lentils. Unsalted nuts, nut butters, and seeds. Unsalted canned beans. Lean cuts of beef with fat trimmed off. Low-sodium, lean precooked or cured meat, such as sausages or meat loaves. Dairy Low-fat (1%) or fat-free (skim) milk. Reduced-fat, low-fat, or fat-free cheeses. Nonfat, low-sodium ricotta or cottage cheese. Low-fat or nonfat yogurt. Low-fat, low-sodium cheese. Fats and oils Soft margarine without trans fats. Vegetable oil. Reduced-fat, low-fat, or light mayonnaise and salad dressings (reduced-sodium). Canola, safflower, olive, avocado, soybean, and sunflower oils. Avocado. Seasonings and condiments Herbs. Spices. Seasoning mixes without salt. Other foods Unsalted popcorn and pretzels. Fat-free sweets. The items listed above may not be a complete list of foods and beverages you can eat. Contact a dietitian for more information. What foods should I avoid? Fruits Canned fruit in a light or heavy syrup. Fried fruit. Fruit in cream or butter sauce. Vegetables Creamed or fried vegetables. Vegetables in a cheese sauce. Regular canned vegetables (not low-sodium or reduced-sodium). Regular canned tomato sauce and paste (not low-sodium or reduced-sodium). Regular tomato and vegetable juice (not low-sodium or reduced-sodium). Pickles. Olives. Grains Baked goods made with fat, such as croissants, muffins, or some breads. Dry pasta or rice meal packs. Meats and other proteins Fatty cuts of meat. Ribs. Fried meat. Bacon. Bologna, salami, and other precooked or cured meats, such as sausages or meat loaves. Fat from the back of a pig (fatback).  Bratwurst. Salted nuts and seeds. Canned beans with added salt. Canned or smoked fish. Whole eggs or egg yolks. Chicken or turkey with skin. Dairy Whole or 2% milk, cream, and half-and-half. Whole or full-fat cream cheese. Whole-fat or sweetened yogurt. Full-fat cheese. Nondairy creamers. Whipped toppings. Processed cheese and cheese spreads. Fats and oils Butter. Stick margarine. Lard. Shortening. Ghee. Bacon fat. Tropical oils, such as coconut, palm kernel, or palm oil. Seasonings and condiments Onion salt, garlic salt, seasoned salt, table salt, and sea salt. Worcestershire sauce. Tartar sauce. Barbecue sauce. Teriyaki sauce. Soy sauce, including reduced-sodium. Steak sauce. Canned and packaged gravies. Fish sauce. Oyster sauce. Cocktail sauce. Store-bought horseradish. Ketchup. Mustard. Meat flavorings and tenderizers. Bouillon cubes. Hot sauces. Pre-made or packaged marinades. Pre-made or packaged taco seasonings. Relishes. Regular salad dressings. Other foods Salted popcorn and pretzels. The items listed above may not be a complete list of foods and beverages you should avoid. Contact a dietitian for more information. Where to find more information  National Heart, Lung, and Blood Institute: www.nhlbi.nih.gov  American Heart Association: www.heart.org  Academy of Nutrition and Dietetics: www.eatright.org  National Kidney Foundation: www.kidney.org Summary  The DASH eating plan is a healthy eating plan that has been shown to reduce high   blood pressure (hypertension). It may also reduce your risk for type 2 diabetes, heart disease, and stroke.  When on the DASH eating plan, aim to eat more fresh fruits and vegetables, whole grains, lean proteins, low-fat dairy, and heart-healthy fats.  With the DASH eating plan, you should limit salt (sodium) intake to 2,300 mg a day. If you have hypertension, you may need to reduce your sodium intake to 1,500 mg a day.  Work with your health care  provider or dietitian to adjust your eating plan to your individual calorie needs. This information is not intended to replace advice given to you by your health care provider. Make sure you discuss any questions you have with your health care provider. Document Revised: 05/10/2019 Document Reviewed: 05/10/2019 Elsevier Patient Education  2021 Elsevier Inc.    Heart Failure, Diagnosis  Heart failure is a condition in which the heart has trouble pumping blood. This may mean that the heart cannot pump enough blood out to the body or that the heart does not fill up with enough blood. For some people with heart failure, fluid may back up into the lungs. There may also be swelling (edema) in the lower legs. Heart failure is usually a long-term (chronic) condition. It is important for you to take good care of yourself and follow the treatment plan from your health care provider. What are the causes? This condition may be caused by:  High blood pressure (hypertension). Hypertension causes the heart muscle to work harder than normal.  Coronary artery disease, or CAD. CAD is the buildup of cholesterol and fat (plaque) in the arteries of the heart.  Heart attack, also called myocardial infarction. This injures the heart muscle, making it hard for the heart to pump blood.  Abnormal heart valves. The valves do not open and close properly, forcing the heart to pump harder to keep the blood flowing.  Heart muscle disease, inflammation, or infection (cardiomyopathy or myocarditis). This is damage to the heart muscle. It can increase the risk of heart failure.  Lung disease. The heart works harder when the lungs are not healthy. What increases the risk? The risk of heart failure increases as a person ages. This condition is also more likely to develop in people who:  Are obese.  Are male.  Use tobacco or nicotine products.  Abuse alcohol or drugs.  Have taken medicines that can damage the heart,  such as chemotherapy drugs.  Have any of these conditions: ? Diabetes. ? Abnormal heart rhythms. ? Thyroid problems. ? Low blood counts (anemia). ? Chronic kidney disease.  Have a family history of heart failure. What are the signs or symptoms? Symptoms of this condition include:  Shortness of breath with activity, such as when climbing stairs.  A cough that does not go away.  Swelling of the feet, ankles, legs, or abdomen.  Losing or gaining weight for no reason.  Trouble breathing when lying flat.  Waking from sleep because of the need to sit up and get more air.  Rapid heartbeat.  Tiredness (fatigue) and loss of energy.  Feeling light-headed, dizzy, or close to fainting.  Nausea or loss of appetite.  Waking up more often during the night to urinate (nocturia).  Confusion. How is this diagnosed? This condition is diagnosed based on:  Your medical history, symptoms, and a physical exam.  Diagnostic tests, which may include: ? Echocardiogram. ? Electrocardiogram (ECG). ? Chest X-ray. ? Blood tests. ? Exercise stress test. ? Cardiac MRI. ? Cardiac  catheterization and angiogram. ? Radionuclide scans. How is this treated? Treatment for this condition is aimed at managing the symptoms of heart failure. Medicines Treatment may include medicines that:  Help lower blood pressure by relaxing (dilating) the blood vessels. These medicines are called ACE inhibitors (angiotensin-converting enzyme), ARBs (angiotensin receptor blockers), or vasodilators.  Cause the kidneys to remove salt and water from the blood through urination (diuretics).  Improve heart muscle strength and prevent the heart from beating too fast (beta blockers).  Increase the force of the heartbeat (digoxin).  Lower heart rates. Certain diabetes medicines (SGLT-2 inhibitors) may also be used in treatment. Healthy behavior changes Treatment may also include making healthy lifestyle changes, such  as:  Reaching and staying at a healthy weight.  Not using tobacco or nicotine products.  Eating heart-healthy foods.  Limiting or avoiding alcohol.  Stopping the use of illegal drugs.  Being physically active.  Participating in a cardiac rehabilitation program, which is a treatment program to improve your health and well-being through exercise training, education, and counseling. Other treatments Other treatments may include:  Procedures to open blocked arteries or repair damaged valves.  Placing a pacemaker to improve heart function (cardiac resynchronization therapy).  Placing a device to treat serious abnormal heart rhythms (implantable cardioverter defibrillator, or ICD).  Placing a device to improve the pumping ability of the heart (left ventricular assist device, or LVAD).  Receiving a healthy heart from a donor (heart transplant). This is done when other treatments have not helped. Follow these instructions at home:  Manage other health conditions as told by your health care provider. These may include hypertension, diabetes, thyroid disease, or abnormal heart rhythms.  Get ongoing education and support as needed. Learn as much as you can about heart failure.  Keep all follow-up visits. This is important. Summary  Heart failure is a condition in which the heart has trouble pumping blood.  This condition is commonly caused by high blood pressure and other diseases of the heart and lungs.  Symptoms of this condition include shortness of breath, tiredness (fatigue), nausea, and swelling of the feet, ankles, legs, or abdomen.  Treatments for this condition may include medicines, lifestyle changes, and surgery.  Manage other health conditions as told by your health care provider. This information is not intended to replace advice given to you by your health care provider. Make sure you discuss any questions you have with your health care provider. Document Revised:  12/28/2019 Document Reviewed: 12/28/2019 Elsevier Patient Education  2021 [employer].

## 2020-07-29 NOTE — Progress Notes (Signed)
Heart Failure Patient Advocate Encounter  [place] Program sent in an effort to reduce the patient's out of pocket expense for Farxiga to $0.    Application completed and faxed to [phone]   [place] patient [place] phone number for follow up is [phone].    Completed application for [employer] Patient Assistance Program sent in an effort to reduce the patient's out of pocket expense for Entresto to $0.    Application completed and faxed to [phone]  [employer] patient assistance phone number for follow up is [phone]   Sharen Hones, PharmD, BCPS Heart Failure Stewardship Pharmacist Phone [phone]  Please check [email or web address] for unit-specific pharmacist phone numbers

## 2020-07-29 NOTE — Progress Notes (Signed)
Heart Failure Stewardship Pharmacist Progress Note   PCP: No primary care provider on file. PCP-Cardiologist: Parke Poisson, MD    HPI:  60 yo M with PMH of tobacco use and untreated HTN. He presented to the ED on 07/24/20 with HTN urgency and shortness of breath. An ECHO was done on 07/25/20 and LVEF is reduced to 35-40%. 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   Current HF Medications: Furosemide 40 mg PRN Carvedilol 6.25 mg BID Entresto 97/103 mg BID Farxiga 10 mg daily Spironolactone 12.5 mg daily Hydralazine 50 mg TID Imdur 30 mg daily  Prior to admission HF Medications: None  Pertinent Lab Values (as of 07/27/20): Marland Kitchen Serum creatinine 1.46, BUN 15, Potassium 4.0, Sodium 140, BNP 330.7  Vital Signs: . Weight: 212 lbs (admission weight: 213 lbs) . Blood pressure: 160/90s  . Heart rate: 80s   Medication Assistance / Insurance Benefits Check: Does the patient have prescription insurance?  No  Does the patient qualify for medication assistance through manufacturers or grants?   Yes . Eligible grants and/or patient assistance programs: [place] . Medication assistance applications in progress: [place]  . Medication assistance applications approved: none Approved medication assistance renewals will be completed by: [facility]  Outpatient Pharmacy:  Prior to admission outpatient pharmacy: [facility] Is the patient willing to use [facility] pharmacy at discharge? Yes Is the patient willing to transition their outpatient pharmacy to utilize a [facility] outpatient pharmacy?   No - does not have a card to put on file, [facility]    Assessment: 1. 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 NYHA class II symptoms. - Plan for furosemide 40 mg daily PRN at discharge - Continue carvedilol  6.25 mg BID - Agree with increasing Entresto to 97/103 mg BID - Continue Farxiga 10 mg daily - Continue spironolactone 12.5 mg daily - Continue hydralazine 50 mg TID - Continue Imdur 30 mg daily   Plan: 1) Medication changes recommended at this time: - Agree with medication changes noted above; discharge today  2) Patient assistance application(s): - Entresto and [place] applications initiated - He can use the free 30-day cards for both Entresto and Farxiga with [facility] pharmacy - Case manager consulted for [facility]  3)  Education  - Patient has been educated on current HF medications - Patient verbalizes understanding that over the next few months, these medication doses may change and more medications may be added to optimize HF regimen - Patient has been educated on basic disease state pathophysiology and goals of therapy - Time spent 30 mins  - Appreciate HF Navigator's assistance  Sharen Hones, PharmD, BCPS Heart Failure Engineer, building services Phone [phone]

## 2020-07-29 NOTE — Discharge Summary (Addendum)
Discharge Summary    Patient ID: Dustin Fowler MRN: [id number]; DOB: 05/04/1961  Admit date: 07/24/2020 Discharge date: 07/29/2020  Primary Care Provider: No primary care provider on file.  Primary Cardiologist: Parke PoissonGayatri A Acharya, MD  Primary Electrophysiologist:  None   Discharge Diagnoses    Principal Problem:   Non-ST elevation (NSTEMI) myocardial infarction [facility](HCC) Active Problems:   Hypertensive urgency   Acute systolic heart failure (HCC)  Combined ischemic/nonischemic cardiomyopathy  CAD  Hyperlipidemia with LDL goal less than 70  Tobacco abuse  Diagnostic Studies/Procedures    Echo 07/25/20 1. Definity contrast given but image quality remains suboptimal. Apex  appears akinetic. There may also be anteroseptal hypokinesis, and possible  basal septal thinning. Consider coronary angiogram and/or cardiac MRI as  part of evaluation to further  assess cardiomyopathy.  2. Left ventricular ejection fraction, by estimation, is 35 to 40%. The  left ventricle has moderately decreased function. Left ventricular  endocardial border not optimally defined to evaluate regional wall motion.  There is severe asymmetric left  ventricular hypertrophy of the posterior-lateral segment. Left ventricular  diastolic parameters are consistent with Grade III diastolic dysfunction  (restrictive). Elevated left ventricular end-diastolic pressure.  3. Right ventricular systolic function is moderately reduced. The right  ventricular size is normal.  4. Left atrial size was severely dilated.  5. The mitral valve is normal in structure. Mild mitral valve  regurgitation. No evidence of mitral stenosis.  6. The aortic valve is normal in structure. Aortic valve regurgitation is  not visualized. No aortic stenosis is present.  7. The inferior vena cava is dilated in size with >50% respiratory  variability, suggesting right atrial pressure of 8 mmHg.    RIGHT/LEFT HEART CATH AND CORONARY  ANGIOGRAPHY 07/27/20    Conclusions: 1. Severe single vessel coronary artery disease with chronic total/subtotal occlusion of the distal RCA/rPLA with bridging collaterals.  Mild to moderate, non-obstructive CAD noted in the left coronary artery. 2. Mildly elevated left and right heart filling pressures. 3. Normal Fick cardiac output/index.  Recommendations: 1. Continue medical therapy of acute systolic heart failure.  Legrand RamsFavor this representing mostly non-ischemic cardiomyopathy as degree of LV dysfunction is out of proportion to coronary artery disease. 2. Aggressive secondary prevention of CAD.  Diagnostic Dominance: Right    History of Present Illness     Dustin Fowler is a 60 y.o. male with hx of tobacco use and untreated HTNpresents with hypertensive urgency and shortness of breath with exertion and orthopnea, now found to have acute systolic HF and hypertensive urgency.  Hospital Course     Consultants: None  1. Acute combined CHF - Echo showed LVEF of 35-40%, grade II DD, WM abnormality as above and elevated LVEDP.  - Given IV lasix 40mg  x 2 initially and then auto diuresed. Net I & O negative 1.06L.  - Patient started on guideline directed medical therapy and uptitrated with resolution of dyspnea, orthopnea and lower extremity edema. - HgbA1c 5.9 -Patient does not have insurance. He was seen by heart failure pharmacist and plan to follow-up [facility] on [facility]11 who will assist with application for Marcelline DeistFarxiga and Sherryll Burgerntresto -[facility] then [facility] will get refills from [facility] clinic.  -Continue Coreg 6.25 mg twice daily, Farxiga 10 mg daily, spironolactone 12.5 mg daily, Entresto 97/103mg  BID, hydralazine 50 mg 3 times daily and Imdur 30 mg daily  2. Elevated troponin /CAD - Hs-troponin 126>>164 - Treated with  heparin -Cath single-vessel CAD with chronic total/subtotal occlusion of the distal RCA/rPLA with bridging  collaterals. Mild to moderate, non-obstructive CAD noted in the left coronary artery. - Recommended medical therapy -Continue aspirin, statin, beta-blocker and Imdur  3. Hypertensive urgency - BP improved on admission unremarkable heart failure regimen but remained elevated. -Further adjustment as outpatient  -Consider blood pressure monitor from patient care for during clinic visit   4. Tobacco smoking - Cessation recommended   5. HLD - 07/25/2020: Cholesterol 260; HDL 80; LDL Cholesterol 169; Triglycerides 53; VLDL 11 - On statin  - Will need lipid panel and LFTs in 6 weeks  6. AKI with resumed CKD III - Scr stable at 1.5 -Entresto increased at discharge >>BMET at follow up  7. Mixed cardiomyopathy - Favored mostly non-ischemic cardiomyopathy as degree of LV dysfunction is out of proportion to coronary artery disease. -Medical therapy as above. -Repeat echo in 3 months    Did the patient have an acute coronary syndrome (MI, NSTEMI, STEMI, etc) this admission?:  Yes                               AHA/ACC Clinical Performance & Quality Measures: 1. Aspirin prescribed? - Yes 2. ADP Receptor Inhibitor (Plavix/Clopidogrel, Brilinta/Ticagrelor or Effient/Prasugrel) prescribed (includes medically managed patients)? - No - no PCI >> medical managment  3. Beta Blocker prescribed? - Yes 4. High Intensity Statin (Lipitor 40-80mg  or Crestor 20-40mg ) prescribed? - Yes 5. EF assessed during THIS hospitalization? - Yes 6. For EF <40%, was ACEI/ARB prescribed? - Yes 7. For EF <40%, Aldosterone Antagonist (Spironolactone or Eplerenone) prescribed? - Yes 8. Cardiac Rehab Phase II ordered (including medically managed patients)? - No - No PCI.     Discharge Vitals Blood pressure (!) 169/104, pulse 87, temperature 98 F (36.7 C), temperature source Oral, resp. rate 17, height 5\' 9"  (1.753 m), weight 96.3 kg, SpO2 100 %.  Filed Weights   07/27/20 0531 07/28/20 0359 07/29/20 0401  Weight:  97.2 kg 96.2 kg 96.3 kg   Physical Exam Constitutional:      Appearance: Normal appearance.  HENT:     Head: Normocephalic.     Nose: Nose normal.  Eyes:     Extraocular Movements: Extraocular movements intact.     Pupils: Pupils are equal, round, and reactive to light.  Cardiovascular:     Rate and Rhythm: Normal rate and regular rhythm.     Pulses: Normal pulses.     Heart sounds: Normal heart sounds.  Pulmonary:     Effort: Pulmonary effort is normal.     Breath sounds: Normal breath sounds.  Abdominal:     General: Abdomen is flat.     Palpations: Abdomen is soft.  Musculoskeletal:        General: Normal range of motion.     Cervical back: Normal range of motion and neck supple.  Skin:    General: Skin is warm and dry.  Neurological:     General: No focal deficit present.     Mental Status: He is alert and oriented to person, place, and time.    Labs & Radiologic Studies    CBC Recent Labs    07/27/20 0222 07/27/20 0954 07/27/20 0956 07/28/20 0334  WBC 6.3  --   --  5.0  HGB 15.0   < > 15.6 15.6  HCT 49.4   < > 46.0 48.0  MCV 78.4*  --   --  75.4*  PLT 215  --   --  240   < > = values in this interval not displayed.   Basic Metabolic Panel Recent Labs    26/41/58 0543 07/27/20 0954 07/27/20 0956 07/29/20 0742  NA 139   < > 141 140  K 3.8   < > 3.7 4.0  CL 104  --   --  106  CO2 22  --   --  24  GLUCOSE 89  --   --  99  BUN 17  --   --  15  CREATININE 1.47*  --   --  1.46*  CALCIUM 8.8*  --   --  9.2   < > = values in this interval not displayed.   High Sensitivity Troponin:   Recent Labs  Lab 07/24/20 1514 07/24/20 1648  TROPONINIHS 126* 164*    _____________  CT Angio Chest PE W and/or Wo Contrast  Result Date: 07/24/2020 CLINICAL DATA:  60 year old male with concern for pulmonary embolism. EXAM: CT ANGIOGRAPHY CHEST WITH CONTRAST TECHNIQUE: Multidetector CT imaging of the chest was performed using the standard protocol during bolus  administration of intravenous contrast. Multiplanar CT image reconstructions and MIPs were obtained to evaluate the vascular anatomy. CONTRAST:  27mL OMNIPAQUE IOHEXOL 350 MG/ML SOLN COMPARISON:  Chest radiograph dated 07/24/2020. FINDINGS: Cardiovascular: There is moderate cardiomegaly. No pericardial effusion. Retrograde flow of contrast from the right atrium into the IVC suggestive of a degree of right heart dysfunction. Mild atherosclerotic calcification of the thoracic aorta. No aneurysmal dilatation. No CT evidence of pulmonary embolism. Mediastinum/Nodes: Mildly enlarged right hilar lymph node measures 16 mm in short axis. Mildly enlarged lymph nodes the right of the upper esophagus measure up to 11 mm short axis. The esophagus and the thyroid gland are grossly unremarkable. No mediastinal fluid collection. Lungs/Pleura: There is diffuse interstitial and interlobular septal prominence consistent with edema. Bilateral lower lobe diffuse ground-glass density as well as a cluster of ground-glass opacity in the right suprahilar region may represent edema. Atypical pneumonia is not excluded clinical correlation recommended. There is no pleural effusion pneumothorax. The central airways are patent. Upper Abdomen: No acute abnormality. Musculoskeletal: No chest wall abnormality. No acute or significant osseous findings. Review of the MIP images confirms the above findings. IMPRESSION: 1. No CT evidence of pulmonary embolism. 2. Moderate cardiomegaly with findings of right heart dysfunction. 3. Bilateral lower lobe diffuse ground-glass density as well as a cluster of ground-glass opacity in the right suprahilar region likely represent edema. Atypical pneumonia is not excluded clinical correlation recommended. 4. Mildly enlarged right hilar and mediastinal lymph nodes, likely reactive. 5. Aortic Atherosclerosis (ICD10-I70.0). Electronically Signed   By: Elgie Collard M.D.   On: 07/24/2020 18:14   CARDIAC  CATHETERIZATION  Result Date: 07/27/2020 Conclusions: 1. Severe single vessel coronary artery disease with chronic total/subtotal occlusion of the distal RCA/rPLA with bridging collaterals.  Mild to moderate, non-obstructive CAD noted in the left coronary artery. 2. Mildly elevated left and right heart filling pressures. 3. Normal Fick cardiac output/index. Recommendations: 1. Continue medical therapy of acute systolic heart failure.  Legrand Rams this representing mostly non-ischemic cardiomyopathy as degree of LV dysfunction is out of proportion to coronary artery disease. 2. Aggressive secondary prevention of CAD. Yvonne Kendall, MD [facility]    DG Chest Port 1 View  Result Date: 07/24/2020 CLINICAL DATA:  Dyspnea, hypoxia EXAM: PORTABLE CHEST 1 VIEW COMPARISON:  None. FINDINGS: The lungs are symmetrically well expanded. No pneumothorax or  pleural effusion. There is mild bilateral perihilar and lower lung zone interstitial infiltrate possibly representing changes of mild interstitial pulmonary edema. There is mild to moderate cardiomegaly. No acute bone abnormality. IMPRESSION: Mild to moderate cardiomegaly. Mild interstitial pulmonary edema in keeping with mild cardiogenic failure. Electronically Signed   By: Helyn Numbers MD   On: 07/24/2020 13:38   ECHOCARDIOGRAM COMPLETE  Result Date: 07/25/2020    ECHOCARDIOGRAM REPORT   Patient Name:   Dustin Fowler Date of Exam: 07/25/2020 Medical Rec #:  [id number]          Height:       69.0 in Accession #:    [id number]         Weight:       230.4 lb Date of Birth:  [birth year]          BSA:          2.194 m Patient Age:    59 years           BP:           169/101 mmHg Patient Gender: M                  HR:           78 bpm. Exam Location:  Inpatient Procedure: 2D Echo, Cardiac Doppler, Color Doppler and Intracardiac            Opacification Agent Indications:    I50.23 Acute on chronic systolic (congestive) heart failure  History:        Patient has no prior  history of Echocardiogram examinations.  Sonographer:    Elmarie Shiley Dance Referring Phys: [id number] AMANDA C CONIGLIO IMPRESSIONS  1. Definity contrast given but image quality remains suboptimal. Apex appears akinetic. There may also be anteroseptal hypokinesis, and possible basal septal thinning. Consider coronary angiogram and/or cardiac MRI as part of evaluation to further assess cardiomyopathy.  2. Left ventricular ejection fraction, by estimation, is 35 to 40%. The left ventricle has moderately decreased function. Left ventricular endocardial border not optimally defined to evaluate regional wall motion. There is severe asymmetric left ventricular hypertrophy of the posterior-lateral segment. Left ventricular diastolic parameters are consistent with Grade III diastolic dysfunction (restrictive). Elevated left ventricular end-diastolic pressure.  3. Right ventricular systolic function is moderately reduced. The right ventricular size is normal.  4. Left atrial size was severely dilated.  5. The mitral valve is normal in structure. Mild mitral valve regurgitation. No evidence of mitral stenosis.  6. The aortic valve is normal in structure. Aortic valve regurgitation is not visualized. No aortic stenosis is present.  7. The inferior vena cava is dilated in size with >50% respiratory variability, suggesting right atrial pressure of 8 mmHg. FINDINGS  Left Ventricle: Left ventricular ejection fraction, by estimation, is 35 to 40%. The left ventricle has moderately decreased function. Left ventricular endocardial border not optimally defined to evaluate regional wall motion. Definity contrast agent was given IV to delineate the left ventricular endocardial borders. The left ventricular internal cavity size was normal in size. There is severe asymmetric left ventricular hypertrophy of the posterior-lateral segment. Left ventricular diastolic parameters are consistent with Grade III diastolic dysfunction (restrictive).  Elevated left ventricular end-diastolic pressure.  LV Wall Scoring: The apical lateral segment, apical septal segment, apical anterior segment, and apical inferior segment are akinetic. Right Ventricle: The right ventricular size is normal. No increase in right ventricular wall thickness. Right ventricular systolic function is moderately reduced. Left Atrium: Left  atrial size was severely dilated. Right Atrium: Right atrial size was normal in size. Pericardium: There is no evidence of pericardial effusion. Mitral Valve: The mitral valve is normal in structure. Mild mitral valve regurgitation. No evidence of mitral valve stenosis. Tricuspid Valve: The tricuspid valve is normal in structure. Tricuspid valve regurgitation is trivial. No evidence of tricuspid stenosis. Aortic Valve: The aortic valve is normal in structure. Aortic valve regurgitation is not visualized. No aortic stenosis is present. Pulmonic Valve: The pulmonic valve was normal in structure. Pulmonic valve regurgitation is trivial. No evidence of pulmonic stenosis. Aorta: The aortic root is normal in size and structure. Venous: The inferior vena cava is dilated in size with greater than 50% respiratory variability, suggesting right atrial pressure of 8 mmHg. IAS/Shunts: No atrial level shunt detected by color flow Doppler.  LEFT VENTRICLE PLAX 2D LVIDd:         5.40 cm  Diastology LVIDs:         4.10 cm  LV e' medial:    3.98 cm/s LV PW:         2.30 cm  LV E/e' medial:  28.9 LV IVS:        1.50 cm  LV e' lateral:   5.63 cm/s LVOT diam:     2.10 cm  LV E/e' lateral: 20.4 LV SV:         42 LV SV Index:   19 LVOT Area:     3.46 cm  RIGHT VENTRICLE            IVC RV Basal diam:  2.90 cm    IVC diam: 2.60 cm RV S prime:     8.10 cm/s TAPSE (M-mode): 1.7 cm LEFT ATRIUM              Index       RIGHT ATRIUM           Index LA diam:        5.20 cm  2.37 cm/m  RA Area:     17.70 cm LA Vol (A2C):   121.0 ml 55.15 ml/m RA Volume:   52.70 ml  24.02 ml/m LA Vol  (A4C):   137.0 ml 62.44 ml/m LA Biplane Vol: 131.0 ml 59.71 ml/m  AORTIC VALVE LVOT Vmax:   84.40 cm/s LVOT Vmean:  52.650 cm/s LVOT VTI:    0.121 m  AORTA Ao Root diam: 3.50 cm Ao Asc diam:  3.20 cm MITRAL VALVE MV Area (PHT): 4.49 cm     SHUNTS MV Decel Time: 169 msec     Systemic VTI:  0.12 m MV E velocity: 115.00 cm/s  Systemic Diam: 2.10 cm MV A velocity: 52.10 cm/s MV E/A ratio:  2.21 Weston Brass MD Electronically signed by Weston Brass MD Signature Date/Time: 07/25/2020/1:56:12 PM    Final    Disposition   Pt is being discharged home today in good condition.  Follow-up Plans & Appointments     Follow-up Information    Abelino Derrick, [place]. Go on 08/04/2020.   Specialties: Cardiology, Radiology Why: @11 :15am for [facility] visit with cardiology Contact information: [address] [place] [address] [phone]              Discharge Instructions    Diet - low sodium heart healthy   Complete by: As directed    Discharge instructions   Complete by: As directed    No driving for 2 days.  No lifting over 5 lbs for  1 week. No sexual activity for 1 week. You may return to work on after office visit. Keep procedure site clean & dry. If you notice increased pain, swelling, bleeding or pus, call/return!  You may shower, but no soaking baths/hot tubs/pools for 1 week.   Increase activity slowly   Complete by: As directed       Discharge Medications   Allergies as of 07/29/2020   No Known Allergies     Medication List    TAKE these medications   aspirin 81 MG EC tablet Take 1 tablet (81 mg total) by mouth daily. Swallow whole. Start taking on: July 30, 2020   atorvastatin 80 MG tablet Commonly known as: LIPITOR Take 1 tablet (80 mg total) by mouth daily. Start taking on: July 30, 2020   carvedilol 6.25 MG tablet Commonly known as: COREG Take 1 tablet (6.25 mg total) by mouth 2 (two) times daily with a meal.   dapagliflozin propanediol 10 MG  Tabs tablet Commonly known as: FARXIGA Take 1 tablet (10 mg total) by mouth daily. Start taking on: July 30, 2020   furosemide 40 MG tablet Commonly known as: Lasix Take 1 tablet (40 mg total) by mouth as needed. For shortness of breath, lower extremity edema or weight gain.   hydrALAZINE 50 MG tablet Commonly known as: APRESOLINE Take 1 tablet (50 mg total) by mouth 3 (three) times daily.   isosorbide mononitrate 30 MG 24 hr tablet Commonly known as: IMDUR Take 1 tablet (30 mg total) by mouth daily. Start taking on: July 30, 2020   nitroGLYCERIN 0.4 MG SL tablet Commonly known as: NITROSTAT Place 1 tablet (0.4 mg total) under the tongue every 5 (five) minutes x 3 doses as needed for chest pain.   sacubitril-valsartan 97-103 MG Commonly known as: ENTRESTO Take 1 tablet by mouth 2 (two) times daily.   spironolactone 25 MG tablet Commonly known as: ALDACTONE Take 0.5 tablets (12.5 mg total) by mouth daily. Start taking on: July 30, 2020          Outstanding Labs/Studies   BMET at follow up  Consider OP f/u labs 6-8 weeks given statin initiation this admission.  Duration of Discharge Encounter   Greater than 30 minutes including physician time.  Signed, Manson Passey, PA 07/29/2020, 10:14 AM  Personally seen and examined. Agree with above.  Ready for discharge.  Close follow-up obtained.  Non-ST elevation myocardial infarction with acute systolic heart failure -EF 35 to 40%.  Now on Entresto high-dose, Farxiga, spironolactone 12.5, hydralazine as well as isosorbide and carvedilol 6.25 twice a day. -Continue to titrate medications in clinic.  Hypertension is the largest driver for his cardiomyopathy at this point.  See cardiac catheterization.  Subtotal of distal RCA.  Bridging collaterals. -Continue with goal-directed medical therapy. -Creatinine 1.5. -Tobacco cessation, nicotine patch ordered.  Donato Schultz, MD

## 2020-07-29 NOTE — Telephone Encounter (Signed)
Patient has TOC appointment with Corine Shelter on 08/04/2020 at 11:15 am per Vin.

## 2020-07-30 NOTE — Progress Notes (Addendum)
Heart  [facility]act [facility]  PCP: No PCP Primary Cardiologist: Weston Brass  HPI:  Dustin Fowler is a 60 y.o.  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  He was seen in the emergency department on 07/24/20 with hypertensive urgency.  He had mildly elevated troponin was volume overloaded.  During his workup he was found to have combined systolic and diastolic CHF, CAD.  He was diuresed and placed on excellent GDMT Coreg 6.25 mg twice daily, Farxiga 10 mg daily, spironolactone 12.5 mg daily, Entresto 97/103mg  BID, hydralazine 50 mg 3 times daily and Imdur 30 mg daily and discharged for further titration outpatient.    ECHO during admission:  1. Definity contrast given but image quality remains suboptimal. Apex  appears akinetic. There may also be anteroseptal hypokinesis, and possible  basal septal thinning. Consider coronary angiogram and/or cardiac MRI as  part of evaluation to further  assess cardiomyopathy.  2. Left ventricular ejection fraction, by estimation, is 35 to 40%. The  left ventricle has moderately decreased function. Left ventricular  endocardial border not optimally defined to evaluate regional wall motion.  There is severe asymmetric left  ventricular hypertrophy of the posterior-lateral segment. Left ventricular  diastolic parameters are consistent with Grade III diastolic dysfunction  (restrictive). Elevated left ventricular end-diastolic pressure.  3. Right ventricular systolic function is moderately reduced. The right  ventricular size is normal.  4. Left atrial size was severely dilated.  5. The mitral valve is normal in structure. Mild mitral valve  regurgitation. No evidence of mitral stenosis.  6. The aortic valve is normal in structure. Aortic valve regurgitation is  not visualized. No aortic stenosis is present.  7. The inferior  vena cava is dilated in size with >50% respiratory  variability, suggesting right atrial pressure of 8 mmHg.    Discharged on 2/9.  Weighing at home but inaccurate.  Has not needed to take lasix at all since d/c.  Rode his bike to the store and back without getting dyspneic and never had to stop.  No chest pain or shortness of breath.  Took his medications at 7:30am but was confused about the dates so not taking all of them.  Any medication that had a start date he took the medication only that day.      ROS: All systems negative except as listed in HPI, PMH and Problem List.  SH:  Social History   Socioeconomic History  . Marital status: Single    Spouse name: Not on file  . Number of children: Not on file  . Years of education: Not on file  . Highest education level: Not on file  Occupational History  . Not on file  Tobacco Use  . Smoking status: Former Smoker    Packs/day: 1.50    Years: 40.00    Pack years: 60.00    Types: Cigarettes    Quit date: 07/24/2020    Years since quitting: 0.0  . Smokeless tobacco: Never Used  . Tobacco comment: willing to stop.   Vaping Use  . Vaping Use: Never used  Substance and Sexual Activity  . Alcohol use: Yes    Alcohol/week: 4.0 standard drinks    Types: 4 Cans of beer per week    Comment: 1 40oz can a day, everyday.  . Drug use: Not Currently  . Sexual activity: Not on file  Other Topics Concern  .  Not on file  Social History Narrative  . Not on file   Social Determinants of Health   Financial Resource Strain: High Risk  . Difficulty of Paying Living Expenses: Hard  Food Insecurity: Food Insecurity Present  . Worried About Programme researcher, broadcasting/film/video in the Last Year: Sometimes true  . Ran Out of Food in the Last Year: Sometimes true  Transportation Needs: Unmet Transportation Needs  . Lack of Transportation (Medical): Yes  . Lack of Transportation (Non-Medical): Yes  Physical Activity: Insufficiently Active  . Days of Exercise  per Week: 4 days  . Minutes of Exercise per Session: 30 min  Stress: Not on file  Social Connections: Not on file  Intimate Partner Violence: Not on file    FH: History reviewed. No pertinent family history.  History reviewed. No pertinent past medical history.  Current Outpatient Medications  Medication Sig Dispense Refill  . aspirin EC 81 MG EC tablet Take 1 tablet (81 mg total) by mouth daily. Swallow whole. 30 tablet 11  . atorvastatin (LIPITOR) 80 MG tablet Take 1 tablet (80 mg total) by mouth daily. 30 tablet 6  . carvedilol (COREG) 6.25 MG tablet Take 1 tablet (6.25 mg total) by mouth 2 (two) times daily with a meal. 60 tablet 6  . dapagliflozin propanediol (FARXIGA) 10 MG TABS tablet Take 1 tablet (10 mg total) by mouth daily. 30 tablet 6  . furosemide (LASIX) 40 MG tablet Take 1 tablet (40 mg total) by mouth as needed. For shortness of breath, lower extremity edema or weight gain. 30 tablet 3  . hydrALAZINE (APRESOLINE) 50 MG tablet Take 1 tablet (50 mg total) by mouth 3 (three) times daily. 90 tablet 6  . isosorbide mononitrate (IMDUR) 30 MG 24 hr tablet Take 1 tablet (30 mg total) by mouth daily. 30 tablet 6  . nicotine (NICODERM CQ) 7 mg/24hr patch Place 1 patch (7 mg total) onto the skin daily. 28 patch 0  . nitroGLYCERIN (NITROSTAT) 0.4 MG SL tablet Place 1 tablet (0.4 mg total) under the tongue every 5 (five) minutes x 3 doses as needed for chest pain. 25 tablet 12  . sacubitril-valsartan (ENTRESTO) 97-103 MG Take 1 tablet by mouth 2 (two) times daily. 60 tablet 11  . spironolactone (ALDACTONE) 25 MG tablet Take 1 tablet (25 mg total) by mouth daily. 30 tablet 6   No current facility-administered medications for this encounter.    Vitals:   07/31/20 1017  BP: (!) 160/90  Pulse: 92  SpO2: 98%  Weight: 99.1 kg (218 lb 6.4 oz)    PHYSICAL EXAM: Cardiac: JVD flat, normal rate and rhythm, clear s1 and s2, no murmurs, rubs or gallops, no LE edema Pulmonary: CTAB, not in  distress Abdominal: non distended abdomen, soft and nontender Psych: Alert, conversant, in good spirits    ASSESSMENT & PLAN:  Combined Systolic and Diastolic CHF, Nonischemic Cardiomyopathy: -longstanding severe HTN, admission BP syst >200, alcohol drinks a 40oz daily  -Single Vessel CTO of distal RCA, Nonobstructive CAD as below -with asymmetric hypertrophy and apical hypokinesis unlikely amyloid most likely hypertensive heart disease -NYHA Class I-II symptoms -not taking medications appropriately spent a lot of time on education, filled pill box while he was here -went over education on how to properly weigh himself and document his weight and when to call, he is doing well with salt restriction -At this point I feel it's safe to go up from 12.5mg  of spironolactone to 25mg  -Continue Coreg 6.25 mg twice  daily, Farxiga 10 mg daily, Entresto 97/103mg  BID, hydralazine 50 mg 3 times daily and Imdur 30 mg daily   Recently Diagnosed CAD: -Single Vessel CTO of distal RCA with collaterals, mild-moderate non obstructive CAD in left -continue asa and atorvastatin 80 -smoking cessation  Tobacco use:  -trying to quit, has cut back significantly but still smoking he says he had success with nicotene patch while inpatient -ordered nicotene patches, referral to PCP to follow up on his efforts  Alcohol Use: -Still drinking one 40oz beer per day, he didn't feel any withdrawal symptoms during hospitalization -Doesn't want community resources but we set a goal of him decreasing by one 40oz every week.   -may be a good candidate for naltrexone when established with PCP and gets insurance  HLD: -continue atorvastatin 80  SDOH: -he will meet with our social worker today to work on Training and development officer, financial needs -he will establish with PCP -we spent copious time on HF education today as well as tobacco and alcohol cessation   Follow up with general cardiology

## 2020-07-30 NOTE — Telephone Encounter (Signed)
Left detail message for patient to call back - to go over TOC questions . May ask for triage or myself

## 2020-07-31 ENCOUNTER — Ambulatory Visit (HOSPITAL_COMMUNITY)
Admit: 2020-07-31 | Discharge: 2020-07-31 | Disposition: A | Discharge status: Home / Self Care | Payer: Self-pay | Source: Ambulatory Visit | Attending: Internal Medicine | Admitting: Internal Medicine

## 2020-07-31 ENCOUNTER — Other Ambulatory Visit: Payer: Self-pay

## 2020-07-31 ENCOUNTER — Encounter (HOSPITAL_COMMUNITY): Payer: Self-pay

## 2020-07-31 VITALS — BP 160/90 | HR 92 | Wt 218.4 lb

## 2020-07-31 DIAGNOSIS — I2510 Atherosclerotic heart disease of native coronary artery without angina pectoris: Secondary | ICD-10-CM | POA: Insufficient documentation

## 2020-07-31 DIAGNOSIS — E785 Hyperlipidemia, unspecified: Secondary | ICD-10-CM | POA: Insufficient documentation

## 2020-07-31 DIAGNOSIS — I110 Hypertensive heart disease with heart failure: Secondary | ICD-10-CM | POA: Insufficient documentation

## 2020-07-31 DIAGNOSIS — Z7982 Long term (current) use of aspirin: Secondary | ICD-10-CM | POA: Insufficient documentation

## 2020-07-31 DIAGNOSIS — Z7984 Long term (current) use of oral hypoglycemic drugs: Secondary | ICD-10-CM | POA: Insufficient documentation

## 2020-07-31 DIAGNOSIS — I428 Other cardiomyopathies: Secondary | ICD-10-CM | POA: Insufficient documentation

## 2020-07-31 DIAGNOSIS — Z87891 Personal history of nicotine dependence: Secondary | ICD-10-CM | POA: Insufficient documentation

## 2020-07-31 DIAGNOSIS — Z79899 Other long term (current) drug therapy: Secondary | ICD-10-CM | POA: Insufficient documentation

## 2020-07-31 DIAGNOSIS — I5042 Chronic combined systolic (congestive) and diastolic (congestive) heart failure: Secondary | ICD-10-CM | POA: Insufficient documentation

## 2020-07-31 DIAGNOSIS — F17200 Nicotine dependence, unspecified, uncomplicated: Secondary | ICD-10-CM

## 2020-07-31 DIAGNOSIS — R410 Disorientation, unspecified: Secondary | ICD-10-CM | POA: Insufficient documentation

## 2020-07-31 DIAGNOSIS — F1010 Alcohol abuse, uncomplicated: Secondary | ICD-10-CM

## 2020-07-31 DIAGNOSIS — I5040 Unspecified combined systolic (congestive) and diastolic (congestive) heart failure: Secondary | ICD-10-CM | POA: Insufficient documentation

## 2020-07-31 DIAGNOSIS — Z720 Tobacco use: Secondary | ICD-10-CM | POA: Insufficient documentation

## 2020-07-31 DIAGNOSIS — F1020 Alcohol dependence, uncomplicated: Secondary | ICD-10-CM | POA: Insufficient documentation

## 2020-07-31 MED ORDER — NICOTINE 7 MG/24HR TD PT24: 7.0000 mg | MEDICATED_PATCH | Freq: Every day | TRANSDERMAL | 0 refills | Status: DC

## 2020-07-31 MED ORDER — SPIRONOLACTONE 25 MG PO TABS: 25.0000 mg | ORAL_TABLET | Freq: Every day | ORAL | 6 refills | Status: DC

## 2020-07-31 NOTE — Progress Notes (Addendum)
Heart Impact Clinic  Heart Failure Pharmacist Encounter  Patient presented to HF impact clinic today with confusion regarding how to take his medications. He brought all pill bottles and his empty pill box (supplied to him at discharge) with him to this visit.   Assisted patient with filling his pill box. Educated and counseled to make sure he has the ability to fill it accurately in the future. All questions were answered. Refill for spironolactone [facility]. He will have enough medication to get him through until he sees [facility] and future fills +/- Medassist will be able to be initiated.   Sharen Hones, PharmD, BCPS Heart Failure [facility] Pharmacist [phone]

## 2020-07-31 NOTE — Patient Instructions (Addendum)
Increase Spironolactone to 25 mg (1 tab) Daily  We have sent you in a prescription for Nicoderm patches to help you quite smoking  Thank you for allowing [place] to provide your heart failure care after your recent hospitalization. Please follow-up with [facility], as scheduled   Do the following things EVERYDAY: 1) Weigh yourself in the morning before breakfast. Write it down and keep it in a log. 2) Take your medicines as prescribed 3) Eat low salt foods--Limit salt (sodium) to 2000 mg per day.  4) Stay as active as you can everyday 5) Limit all fluids for the day to less than 2 liters  Your physician discussed the hazards of tobacco use. Tobacco use cessation is recommended and techniques and options to help you quit were discussed.

## 2020-07-31 NOTE — Telephone Encounter (Signed)
Patient contacted regarding discharge from [facility] on 07/29/20.  Patient understands to follow up with provider Corine Shelter, PA on 08/04/20 at 11:15 am at [facility]. Patient understands discharge instructions? yes Patient understands medications and regiment? yes Patient understands to bring all medications to this visit? yes

## 2020-07-31 NOTE — Progress Notes (Signed)
Heart and Vascular Care Navigation  07/31/2020  Dustin Fowler 1960/12/02 [id number]  Reason for Referral: Pt referred to assist with food insecurity concerns, lack of transportation, and lack of insurance.                                                                                                    Assessment:  CSW met with pt during [facility] Clinic visit to discuss above concerns.  Pt states that he has been living in a motel, [facility], for the past 3 years.  He likes staying there and plans to continue living there as it is convenient to work and the other places he needs to go.  Pt sometimes struggles affording food.  Had food stamps previously but did not receive a large amount of benefits due to income level so didn't re certify when the time came.  Utilizes food pantries when needed.  Currently with sufficient food at home.  Referral sent in for food stamp application with [facility].  Pt can't remember the last time he had insurance coverage.  States his job does not offer any benefits and he has not attempted to apply for anything outside of his work.  [employer] screened for Medicaid but does not think he meets disability criteria so will not be assisting him.  CSW provided with CAFA application to help apply for [place] specific assistance.  Pt normally gets where he needs to go by bike.  States it has been hard with the cold weather to get where he needs to.  CSW provided with [facility] transport info and helped set up ride for pt on Tuesday to primary cardiology office.                                    HRT/VAS Care Coordination    Patients Home Cardiology Office [place] Team Social Worker   Social Worker Name: Westley Hummer- [facility]- [phone]   Living arrangements for the past 2 months Hotel[employer]el  for past 3 years   Lives with: Self   Patient Current Insurance Coverage Self-Pay   Patient Has Concern With Paying  Medical Bills Yes   Patient Concerns With Medical Bills no insurance and recent hospital stay- screened and found ineligible for Medicaid by [facility] Referrals: CAFA   Does Patient Have Prescription Coverage? No   Patient Prescription Assistance Programs Patient Assistance Programs; [place] Medassist   [place] Med[facility]ist Medications provided application 0/13/14   Patient Assistance Programs Medications [employer] for [employer] and [place] for [employer]   DME Agency NA      Social History:  SDOH Screenings   Alcohol Screen: Low Risk   . Last Alcohol Screening Score (AUDIT): 6  Depression (PHQ2-9): Not on file  Financial Resource Strain: High Risk  . Difficulty of Paying Living Expenses: Hard  Food Insecurity: Food Insecurity Present  . Worried About Charity fundraiser in the Last Year: Sometimes true  . Ran Out of Food in the Last Year: Sometimes true  Housing: Low Risk   . Last Housing Risk Score: 0  Physical Activity: Insufficiently Active  . Days of Exercise per Week: 4 days  . Minutes of Exercise per Session: 30 min  Social Connections: Not on file  Stress: Not on file  Tobacco Use: Medium Risk  . Smoking Tobacco Use: Former Smoker  . Smokeless Tobacco Use: Never Used  Transportation Needs: Unmet Transportation Needs  . Lack of Transportation (Medical): Yes  . Lack of Transportation (Non-Medical): Yes    SDOH Interventions: Financial Resources:  Financial Strain Interventions: Other (Comment) (CAFA application given) Works full time gets about $1,200/month  Food Insecurity:  Food Insecurity Interventions: Other (Comment) (referral sent in for food stamp assistance through [facility])  Housing Insecurity:  Housing Interventions: Intervention Not Indicated  Transportation:   Transportation Interventions: Financial planner    Other Care Navigation Interventions:     Provided  Pharmacy assistance resources Patient Assistance Programs,[place] Medassist application provided   Follow-up plan:    CSW providing handoff to [employer] CSW to help pt follow up on [place] Medassist app and CAFA app.  Pt instructed to try and get necessary paperwork together and bring to [facility] appt next week.  Jorge Ny, LCSW Clinical Social Worker Advanced Heart Failure Clinic Desk#: [phone] Cell#: [phone]

## 2020-08-03 ENCOUNTER — Telehealth: Payer: Self-pay | Admitting: Licensed Clinical Social Worker

## 2020-08-03 NOTE — Telephone Encounter (Signed)
Called pt to f/u before appt tomorrow at [facility]- he was seen at [facility] clinic Friday 2/11, by my colleague Eileen Stanford, LCSW. HIPAA compliant message left w/ pt on his preferred # (cell phone) at [phone]. Appreciate Jenna's assistance w/ initial assessment and arranging ride to appointment on 2/15.   Octavio Graves, MSW, LCSW [facility] Heart/Vascular Care Navigation  [phone]

## 2020-08-03 NOTE — Telephone Encounter (Signed)
[employer] and confirmed that pt has updated his pick up location to his job for tomorrow in order to get to his appointment.   Dustin Fowler, MSW, LCSW [facility] Heart/Vascular Care Navigation  [phone]

## 2020-08-03 NOTE — Telephone Encounter (Signed)
Pt called this writer back from [phone].  Introduced self, role, reason for call. Pt confirms he is coming to appointment tomorrow, he would like to change the pick up location as he is going to try and get a few hours in at work before coming. He confirmed he has the [employer] number and will call them to change the pick up location- he knows to call me if this is an issue/he has trouble with changing location. He has started his assistance applications provided by [place] during the appointment Friday and will bring them to the appointment tomorrow. No additional needs at this time- I let pt know that I look forward to seeing him in the clinic tomorrow.   Octavio Graves, MSW, LCSW [facility] Heart/Vascular Care Navigation  [phone]

## 2020-08-04 ENCOUNTER — Telehealth: Payer: Self-pay | Admitting: Licensed Clinical Social Worker

## 2020-08-04 ENCOUNTER — Encounter: Payer: Self-pay | Admitting: Cardiology

## 2020-08-04 ENCOUNTER — Other Ambulatory Visit: Payer: Self-pay

## 2020-08-04 ENCOUNTER — Ambulatory Visit (INDEPENDENT_AMBULATORY_CARE_PROVIDER_SITE_OTHER): Payer: Self-pay | Admitting: Cardiology

## 2020-08-04 VITALS — BP 181/98 | HR 98 | Ht 69.0 in | Wt 220.2 lb

## 2020-08-04 DIAGNOSIS — F17200 Nicotine dependence, unspecified, uncomplicated: Secondary | ICD-10-CM

## 2020-08-04 DIAGNOSIS — I160 Hypertensive urgency: Secondary | ICD-10-CM

## 2020-08-04 DIAGNOSIS — I5041 Acute combined systolic (congestive) and diastolic (congestive) heart failure: Secondary | ICD-10-CM

## 2020-08-04 DIAGNOSIS — I5042 Chronic combined systolic (congestive) and diastolic (congestive) heart failure: Secondary | ICD-10-CM

## 2020-08-04 DIAGNOSIS — I25118 Atherosclerotic heart disease of native coronary artery with other forms of angina pectoris: Secondary | ICD-10-CM | POA: Insufficient documentation

## 2020-08-04 DIAGNOSIS — I429 Cardiomyopathy, unspecified: Secondary | ICD-10-CM | POA: Insufficient documentation

## 2020-08-04 DIAGNOSIS — I422 Other hypertrophic cardiomyopathy: Secondary | ICD-10-CM | POA: Insufficient documentation

## 2020-08-04 DIAGNOSIS — I2510 Atherosclerotic heart disease of native coronary artery without angina pectoris: Secondary | ICD-10-CM

## 2020-08-04 DIAGNOSIS — I428 Other cardiomyopathies: Secondary | ICD-10-CM

## 2020-08-04 DIAGNOSIS — N289 Disorder of kidney and ureter, unspecified: Secondary | ICD-10-CM | POA: Insufficient documentation

## 2020-08-04 NOTE — Patient Instructions (Addendum)
Medication Instructions:  Continue current medications  *If you need a refill on your cardiac medications before your next appointment, please call your pharmacy*   Lab Work: None Ordered   Testing/Procedures: Your physician has requested that you have an echocardiogram in May. Echocardiography is a painless test that uses sound waves to create images of your heart. It provides your doctor with information about the size and shape of your heart and how well your heart's chambers and valves are working. This procedure takes approximately one hour. There are no restrictions for this procedure.   Follow-Up: At [facility], you and your health needs are our priority.  As part of our continuing mission to provide you with exceptional heart care, we have created designated Provider Care Teams.  These Care Teams include your primary Cardiologist (physician) and Advanced Practice Providers (APPs -  Physician Assistants and Nurse Practitioners) who all work together to provide you with the care you need, when you need it.  We recommend signing up for the patient portal called "MyChart".  Sign up information is provided on this After Visit Summary.  MyChart is used to connect with patients for Virtual Visits (Telemedicine).  Patients are able to view lab/test results, encounter notes, upcoming appointments, etc.  Non-urgent messages can be sent to your provider as well.   To learn more about what you can do with MyChart, go to [email or web address].    Your next appointment:   3 month(s)  The format for your next appointment:   In Person  Provider:   You may see Parke Poisson, MD or one of the following Advanced Practice Providers on your designated Care Team:    Theodore Demark, PA-C  Joni Reining, DNP, ANP

## 2020-08-04 NOTE — Progress Notes (Signed)
Met with pt during his appointment with Kerin Ransom, PA. Pt had been able to take transportation services to his appointment today. He has brought with him what he has completed thus far from [employer]) and [place] MedAssist applications. I made copies of those applications, we reviewed additional documentation needs for each application and that pt can bring those to me at the office utilizing [employer] again. Pt states understanding, was also given my card and Transportation Services card again. Pt knows I remain available for any additional questions/concerns. I will f/u with him before the end of the week.   Dustin Fowler, MSW, [place]  [phone]

## 2020-08-04 NOTE — Assessment & Plan Note (Signed)
B/P under improved control though we could do better. Consider increasing Coreg- the patient has a f/u with the [facility] in a few days, will defer medication titration and f/u lab work to them.

## 2020-08-04 NOTE — Assessment & Plan Note (Signed)
Occluded distal RCA, 40-50% residual CAD- medical Rx

## 2020-08-04 NOTE — Assessment & Plan Note (Signed)
Suspected to be primarily non ischemic-EF 35-40%

## 2020-08-04 NOTE — Progress Notes (Signed)
Cardiology Office Note:    Date:  08/04/2020   ID:  Dustin Fowler, DOB 28-Jul-1960, MRN [id number]  PCP:  Pcp, No  Cardiologist:  Parke Poisson, MD  Electrophysiologist:  None   Referring MD: No ref. provider found   No chief complaint on file.   History of Present Illness:    Dustin Fowler is a 60 y.o. male with a hx of of untreated hypertension.  He was admitted to [facility] on 07/24/2020 to 07/29/2020.  He presented with acute CHF.  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  His ejection fraction was 35 to 40% with severe left atrial dilatation and RV dysfunction.  Catheterization revealed distal RCA disease which is to be treated medically.  Patient was diuresed and his medications adjusted.  He is in the office today for post hospital follow-up.  Since discharge he has been doing well.  He is taking his medications as directed.  He denies any unusual dyspnea, edema, orthopnea.  No past medical history on file.  Past Surgical History:  Procedure Laterality Date  . RIGHT/LEFT HEART CATH AND CORONARY ANGIOGRAPHY N/A 07/27/2020   Procedure: RIGHT/LEFT HEART CATH AND CORONARY ANGIOGRAPHY;  Surgeon: Yvonne Kendall, MD;  Location: [facility];  Service: Cardiovascular;  Laterality: N/A;    Current Medications: Current Meds  Medication Sig  . aspirin EC 81 MG EC tablet Take 1 tablet (81 mg total) by mouth daily. Swallow whole.  Marland Kitchen atorvastatin (LIPITOR) 80 MG tablet Take 1 tablet (80 mg total) by mouth daily.  . carvedilol (COREG) 6.25 MG tablet Take 1 tablet (6.25 mg total) by mouth 2 (two) times daily with a meal.  . dapagliflozin propanediol (FARXIGA) 10 MG TABS tablet Take 1 tablet (10 mg total) by mouth daily.  . furosemide (LASIX) 40 MG tablet Take 1 tablet (40 mg total) by mouth as needed. For shortness of breath, lower extremity edema or weight gain.  . hydrALAZINE (APRESOLINE) 50  MG tablet Take 1 tablet (50 mg total) by mouth 3 (three) times daily.  . isosorbide mononitrate (IMDUR) 30 MG 24 hr tablet Take 1 tablet (30 mg total) by mouth daily.  . nicotine (NICODERM CQ) 7 mg/24hr patch Place 1 patch (7 mg total) onto the skin daily.  . nitroGLYCERIN (NITROSTAT) 0.4 MG SL tablet Place 1 tablet (0.4 mg total) under the tongue every 5 (five) minutes x 3 doses as needed for chest pain.  . sacubitril-valsartan (ENTRESTO) 97-103 MG Take 1 tablet by mouth 2 (two) times daily.  Marland Kitchen spironolactone (ALDACTONE) 25 MG tablet Take 1 tablet (25 mg total) by mouth daily.     Allergies:   Patient has no known allergies.   Social History   Socioeconomic History  . Marital status: Single    Spouse name: Not on file  . Number of children: Not on file  . Years of education: Not on file  . Highest education level: Not on file  Occupational History  . Not on file  Tobacco Use  . Smoking status: Former Smoker    Packs/day: 1.50    Years: 40.00    Pack years: 60.00    Types: Cigarettes    Quit date: 07/24/2020    Years since quitting: 0.0  . Smokeless tobacco: Never Used  . Tobacco comment: willing to stop.   Vaping Use  . Vaping Use: Never used  Substance and Sexual Activity  .  Alcohol use: Yes    Alcohol/week: 4.0 standard drinks    Types: 4 Cans of beer per week    Comment: 1 40oz can a day, everyday.  . Drug use: Not Currently  . Sexual activity: Not on file  Other Topics Concern  . Not on file  Social History Narrative  . Not on file   Social Determinants of Health   Financial Resource Strain: High Risk  . Difficulty of Paying Living Expenses: Hard  Food Insecurity: Food Insecurity Present  . Worried About Programme researcher, broadcasting/film/video in the Last Year: Sometimes true  . Ran Out of Food in the Last Year: Sometimes true  Transportation Needs: Unmet Transportation Needs  . Lack of Transportation (Medical): Yes  . Lack of Transportation (Non-Medical): Yes  Physical Activity:  Insufficiently Active  . Days of Exercise per Week: 4 days  . Minutes of Exercise per Session: 30 min  Stress: Not on file  Social Connections: Not on file     Family History: The patient's family history is not on file.  ROS:   Please see the history of present illness.     All other systems reviewed and are negative.  EKGs/Labs/Other Studies Reviewed:    The following studies were reviewed today: Echo 07/25/2020- IMPRESSIONS    1. Definity contrast given but image quality remains suboptimal. Apex  appears akinetic. There may also be anteroseptal hypokinesis, and possible  basal septal thinning. Consider coronary angiogram and/or cardiac MRI as  part of evaluation to further  assess cardiomyopathy.  2. Left ventricular ejection fraction, by estimation, is 35 to 40%. The  left ventricle has moderately decreased function. Left ventricular  endocardial border not optimally defined to evaluate regional wall motion.  There is severe asymmetric left  ventricular hypertrophy of the posterior-lateral segment. Left ventricular  diastolic parameters are consistent with Grade III diastolic dysfunction  (restrictive). Elevated left ventricular end-diastolic pressure.  3. Right ventricular systolic function is moderately reduced. The right  ventricular size is normal.  4. Left atrial size was severely dilated.  5. The mitral valve is normal in structure. Mild mitral valve  regurgitation. No evidence of mitral stenosis.  6. The aortic valve is normal in structure. Aortic valve regurgitation is  not visualized. No aortic stenosis is present.  7. The inferior vena cava is dilated in size with >50% respiratory  variability, suggesting right atrial pressure of 8 mmHg.   Cath 07/27/2020- Conclusions: 1. Severe single vessel coronary artery disease with chronic total/subtotal occlusion of the distal RCA/rPLA with bridging collaterals.  Mild to moderate, non-obstructive CAD noted in the  left coronary artery. 2. Mildly elevated left and right heart filling pressures. 3. Normal Fick cardiac output/index.  Recommendations: 1. Continue medical therapy of acute systolic heart failure.  Legrand Rams this representing mostly non-ischemic cardiomyopathy as degree of LV dysfunction is out of proportion to coronary artery disease. 2. Aggressive secondary prevention of CAD.    EKG:  EKG is not ordered today.  The ekg ordered 07/27/2020 demonstrates NSR, ST HR 111, LVH with diffuse ST changes  Recent Labs: 07/24/2020: ALT 24; B Natriuretic Peptide 330.7 07/25/2020: TSH 1.607 07/28/2020: Hemoglobin 15.6; Platelets 240 07/29/2020: BUN 15; Creatinine, Ser 1.46; Potassium 4.0; Sodium 140  Recent Lipid Panel    Component Value Date/Time   CHOL 260 (H) 07/25/2020 0809   TRIG 53 07/25/2020 0809   HDL 80 07/25/2020 0809   CHOLHDL 3.3 07/25/2020 0809   VLDL 11 07/25/2020 0809   LDLCALC  169 (H) 07/25/2020 0809    Physical Exam:    VS:  BP (!) 181/98   Pulse 98   Ht 5\' 9"  (1.753 m)   Wt 220 lb 3.2 oz (99.9 kg)   SpO2 99%   BMI 32.52 kg/m     Wt Readings from Last 3 Encounters:  08/04/20 220 lb 3.2 oz (99.9 kg)  07/31/20 218 lb 6.4 oz (99.1 kg)  07/29/20 212 lb 4.8 oz (96.3 kg)    Repeat B/P by me 136/80- regular cuff  GEN: Well nourished, well developed in no acute distress HEENT: Normal NECK: No JVD; No carotid bruits CARDIAC: RRR, no murmurs, rubs, gallops RESPIRATORY:  Clear to auscultation without rales, wheezing or rhonchi  ABDOMEN: Soft, non-tender, non-distended MUSCULOSKELETAL:  No edema; No deformity  SKIN: Warm and dry NEUROLOGIC:  Alert and oriented x 3 PSYCHIATRIC:  Normal affect   ASSESSMENT:    Acute combined systolic and diastolic heart failure (HCC) Hospitalized 2/04-2/02/2021- Stable today on f/u  Hypertensive urgency B/P under improved control though we could do better. Consider increasing Coreg- the patient has a f[facility] in a few  days, will defer medication titration and f/u lab work to them.   Tobacco use disorder He has cut back, working on quitting  Cardiomyopathy [facility]) Suspected to be primarily non ischemic-EF 35-40%  CAD (coronary artery disease) Occluded distal RCA, 40-50% residual CAD- medical Rx  Renal insufficiency SCr 1.4-1.45.  Presumable chronic.  Needs f/u BMP, will defer [facility]  PLAN:    Keep f/u with Dr Frances Furbish as scheduled. Echo in May-f/u Dr Jacques Navy after.    Medication Adjustments/Labs and Tests Ordered: Current medicines are reviewed at length with the patient today.  Concerns regarding medicines are outlined above.  Orders Placed This Encounter  Procedures  . ECHOCARDIOGRAM COMPLETE   No orders of the defined types were placed in this encounter.   Patient Instructions  Medication Instructions:  Continue current medications  *If you need a refill on your cardiac medications before your next appointment, please call your pharmacy*   Lab Work: None Ordered   Testing/Procedures: Your physician has requested that you have an echocardiogram in May. Echocardiography is a painless test that uses sound waves to create images of your heart. It provides your doctor with information about the size and shape of your heart and how well your heart's chambers and valves are working. This procedure takes approximately one hour. There are no restrictions for this procedure.   Follow-Up: At [facility], you and your health needs are our priority.  As part of our continuing mission to provide you with exceptional heart care, we have created designated Provider Care Teams.  These Care Teams include your primary Cardiologist (physician) and Advanced Practice Providers (APPs -  Physician Assistants and Nurse Practitioners) who all work together to provide you with the care you need, when you need it.  We recommend signing up for the patient portal called "MyChart".  Sign up  information is provided on this After Visit Summary.  MyChart is used to connect with patients for Virtual Visits (Telemedicine).  Patients are able to view lab/test results, encounter notes, upcoming appointments, etc.  Non-urgent messages can be sent to your provider as well.   To learn more about what you can do with MyChart, go to [email or web address].    Your next appointment:   3 month(s)  The format for your next appointment:   In Person  Provider:   You may see Parke Poisson, MD or one of the following Advanced Practice Providers on your designated Care Team:    Theodore Demark, PA-C  Joni Reining, DNP, ANP       Signed, Corine Shelter, PA-C  08/04/2020 12:07 PM    [facility]

## 2020-08-04 NOTE — Assessment & Plan Note (Signed)
Hospitalized 2/04-2/02/2021- Stable today on f/u

## 2020-08-04 NOTE — Assessment & Plan Note (Signed)
He has cut back, working on quitting

## 2020-08-04 NOTE — Assessment & Plan Note (Signed)
SCr 1.4-1.45.  Presumable chronic.  Needs f/u BMP, will [facility]

## 2020-08-07 ENCOUNTER — Telehealth: Payer: Self-pay | Admitting: Licensed Clinical Social Worker

## 2020-08-07 NOTE — Telephone Encounter (Signed)
Reached out to pt via text as he shared with me during appointment that he works until around 4:30pm. Let pt know to get in touch if any additional questions regarding applications provided. 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    Octavio Graves, MSW, LCSW [facility] Heart/Vascular Care Navigation  [phone]

## 2020-08-12 ENCOUNTER — Telehealth: Payer: Self-pay | Admitting: Licensed Clinical Social Worker

## 2020-08-12 NOTE — Telephone Encounter (Signed)
LCSW called pt after he had mistakenly called my colleague Eileen Stanford in the AHF clinic. Reached him at [phone], pt at work. He shares that he is interested in resources for transportation to the store and other locations. He also has connected w/ [employer] [facility] and has been working w/ them to have that submitted. He shares that food has been tight. I offered the box of food currently in our office. Pt interested. His plan is to bring additional assistance application documents to [facility] office Friday. LCSW will provide pt w/ [employer], [employer] information and the food box we have available.   Dustin Fowler, MSW, LCSW [facility] Heart/Vascular Care Navigation  [phone]

## 2020-08-14 ENCOUNTER — Other Ambulatory Visit: Payer: Self-pay | Admitting: *Deleted

## 2020-08-14 ENCOUNTER — Other Ambulatory Visit: Payer: Self-pay | Admitting: Cardiology

## 2020-08-14 ENCOUNTER — Telehealth: Payer: Self-pay | Admitting: Licensed Clinical Social Worker

## 2020-08-14 ENCOUNTER — Other Ambulatory Visit (HOSPITAL_COMMUNITY): Payer: Self-pay

## 2020-08-14 MED ORDER — SPIRONOLACTONE 25 MG PO TABS: 25.0000 mg | ORAL_TABLET | Freq: Every day | ORAL | 11 refills | Status: DC

## 2020-08-14 MED ORDER — SPIRONOLACTONE 25 MG PO TABS: 25.0000 mg | ORAL_TABLET | Freq: Every day | ORAL | 2 refills | Status: DC

## 2020-08-14 MED FILL — SPIRONOLACTONE 25 MG TABLET: 25 | 30 days supply | Qty: 30 | Fill #0

## 2020-08-14 NOTE — Telephone Encounter (Signed)
Rx has been sent to the pharmacy electronically. ° °

## 2020-08-17 NOTE — Progress Notes (Signed)
Heart and Vascular Care Navigation  08/17/2020  STEEL KERNEY August 15, 1960 [id number]  Reason for Referral:  Pt bringing more documents for assistance applications; pt also to pick up food assistance in office                                                                                                    Assessment:                                     LCSW met with pt this afternoon, he has brought his check stubs for [employer] and [place] [employer] applications. He signed the 4506-t form to request transcript as he does not have full 1040/tax transcript. Pt also brought a letter from hotel stating how long he has lived there. Pt provided w/ additional information about food banks and provided w/ bag of food donated from local faith community. Pt has been utilizing food banks   HRT/VAS Care Coordination    Patients Home Cardiology Office [place] Team Social Worker   Social Worker Name: Westley Hummer- [phone]   Living arrangements for the past 2 months Hotel/Motel  for past 3 years   Lives with: Self   Patient Current Insurance Coverage Self-Pay   Patient Has Concern With Paying Medical Bills Yes   Patient Concerns With Medical Bills no insurance and recent hospital stay- screened and found ineligible for Medicaid by [facility] Referrals: CAFA   Does Patient Have Prescription Coverage? No   Patient Prescription Assistance Programs Patient Assistance Programs; [place] [employer]   [place] [employer] Medications provided application 7/68/08   Patient Assistance Programs Medications [employer] for [employer] and [place] for [employer]   DME Agency NA      Social History:                                                                             SDOH Screenings   Alcohol Screen: Low Risk    Last Alcohol Screening Score (AUDIT): 6  Depression (PHQ2-9): Not on file  Financial Resource Strain: High Risk   Difficulty of Paying Living  Expenses: Hard  Food Insecurity: Food Insecurity Present   Worried About Charity fundraiser in the Last Year: Sometimes true   Ran Out of Food in the Last Year: Sometimes true  Housing: Low Risk    Last Housing Risk Score: 0  Physical Activity: Insufficiently Active   Days of Exercise per Week: 4 days   Minutes of Exercise per Session: 30 min  Social Connections: Not on file  Stress: Not on file  Tobacco Use: Medium Risk   Smoking Tobacco Use: Former Smoker   Smokeless Tobacco Use: Never  Used  Transportation Needs: Public librarian (Medical): Yes   Lack of Transportation (Non-Medical): Yes    SDOH Interventions: Financial Resources:  Financial Strain Interventions: Other (Comment) (additional documents gathered for [employer] and [employer])  Food Insecurity:  Food Insecurity Interventions: Other (Comment) (provided w/ box of donated food; provided [employer])   Follow-up plan:   LCSW assisted pt w/ submitting 4506-t for transcript- he will need that for his [employer]FA application.  Pt [place] [employer] application tentatively submitted, await response to ensure all needed documents were sent. Pt has my number for any additional questions/concerns.

## 2020-08-19 ENCOUNTER — Telehealth: Payer: Self-pay | Admitting: Licensed Clinical Social Worker

## 2020-08-19 NOTE — Telephone Encounter (Signed)
Called pt to ensure he has a ride or will schedule one to PCP appt on Friday 3/4.  [place] [employer] also requesting additional pay stubs from pt from January. LCSW left this information on voicemail at [phone], will reattempt tomorrow as able.   Octavio Graves, MSW, LCSW [facility] Heart/Vascular Care Navigation  [phone]

## 2020-08-21 ENCOUNTER — Telehealth: Payer: Self-pay | Admitting: Licensed Clinical Social Worker

## 2020-08-21 ENCOUNTER — Ambulatory Visit: Payer: Self-pay | Attending: Internal Medicine | Admitting: Internal Medicine

## 2020-08-21 ENCOUNTER — Encounter: Payer: Self-pay | Admitting: Internal Medicine

## 2020-08-21 ENCOUNTER — Other Ambulatory Visit: Payer: Self-pay | Admitting: Internal Medicine

## 2020-08-21 ENCOUNTER — Other Ambulatory Visit: Payer: Self-pay

## 2020-08-21 VITALS — BP 177/99 | HR 82 | Resp 16 | Ht 70.0 in | Wt 222.4 lb

## 2020-08-21 DIAGNOSIS — I5042 Chronic combined systolic (congestive) and diastolic (congestive) heart failure: Secondary | ICD-10-CM

## 2020-08-21 DIAGNOSIS — N1831 Chronic kidney disease, stage 3a: Secondary | ICD-10-CM

## 2020-08-21 DIAGNOSIS — Z2821 Immunization not carried out because of patient refusal: Secondary | ICD-10-CM

## 2020-08-21 DIAGNOSIS — Z7689 Persons encountering health services in other specified circumstances: Secondary | ICD-10-CM

## 2020-08-21 DIAGNOSIS — F17200 Nicotine dependence, unspecified, uncomplicated: Secondary | ICD-10-CM

## 2020-08-21 DIAGNOSIS — F1020 Alcohol dependence, uncomplicated: Secondary | ICD-10-CM | POA: Insufficient documentation

## 2020-08-21 DIAGNOSIS — I25118 Atherosclerotic heart disease of native coronary artery with other forms of angina pectoris: Secondary | ICD-10-CM

## 2020-08-21 DIAGNOSIS — R7303 Prediabetes: Secondary | ICD-10-CM | POA: Insufficient documentation

## 2020-08-21 DIAGNOSIS — I10 Essential (primary) hypertension: Secondary | ICD-10-CM

## 2020-08-21 MED ORDER — HYDRALAZINE HCL 25 MG PO TABS: ORAL_TABLET | ORAL | 6 refills | Status: DC

## 2020-08-21 MED ORDER — NICOTINE POLACRILEX 2 MG MT GUM
CHEWING_GUM | OROMUCOSAL | 2 refills | Status: DC
Start: 2020-08-21 — End: 2020-12-11

## 2020-08-21 MED FILL — hydrALAZINE HCL 25 MG TABS: 25 | 30 days supply | Qty: 90 | Fill #0

## 2020-08-21 NOTE — Patient Instructions (Signed)
We have increased the hydralazine to 75 mg 3 times a day.  This means she will take 50 mg p.o. or 25 mg tablet 3 times a day.  I have sent a prescription to the pharmacy for the nicotine gum for you to use as needed.  Follow-up with our clinical pharmacist in 1 week for blood pressure recheck.

## 2020-08-21 NOTE — Telephone Encounter (Signed)
LCSW f/u with pt this afternoon [phone]), pt confirmed that he is aware of [facility] appt today and will call transportation services himself to get there from work. He is aware that we need two additional pay stubs per [place] [employer] from January as he was in hospital during one pay period in February. He shares that the payroll department is not in on Friday. We agreed for me to text him with the information needed and he will f/u when he can. Await those pay stubs to complete [place] [employer] application. Pt also has started [employer] but is waiting on transcript for tax return to submit.   Octavio Graves, MSW, LCSW [facility] Heart/Vascular Care Navigation  [phone]

## 2020-08-21 NOTE — Progress Notes (Signed)
Patient ID: Dustin Fowler, male    DOB: 07-Jun-1961  MRN: [id number]  CC: Hospitalization Follow-up   Subjective: Dustin Fowler is a 60 y.o. male who presents for new patient hospital follow-up visit. His concerns today include:  Patient with history of HTN, CHF systolic and diastolic EF 35-40%, CAD with combined ICM and NICM, mixed HL, tobacco dependence, pre DM,  ETOH use disorder  No previous PCP. Patient hospitalized 2/4-9/22 with hypertensive urgency with acute CHF and non-ST elevation MI. He had elevated troponins.  cardiac catheterization revealed subtotal to total occlusion of the distal RCA with collaterals and mild to moderate nonobstructive CAD in the left coronary artery.  Medical therapy recommended.  He was started on aspirin, statin, beta-blocker and Imdur.  Echo revealed EF of 35 to 40% with grade 2 diastolic dysfunction and wall motion abnormalities.  A1c was 5.9.  Patient started on [place].  He was noted to have AKI with presumed CKD 3.  Creatinine at the time of discharge was 1.5.  Today: Since discharge he has seen the cardiology PA on 08/04/2020. He reports compliance with the medications. No recent use of sublingual nitroglycerin. Denies any chest pains, shortness of breath, headaches or dizziness, lower extremity edema at this time.  No PND orthopnea.  He is limiting salt in the foods.  He has not had to use the furosemide over the past week.  Tobacco dependence: Working on trying to quit smoking.  He used to smoke a half a pack a day.  Now at 3 cigarettes a day.  He is smoked for the past 18 years and had never tried quitting during that time.  He was given the nicotine patch on discharge from the hospital but does not find it helpful.  He states he would prefer to try the nicotine gum.  Alcohol use: Reports that he used to drink two 40 ounce beers a day with some liquor.  Since being discharged from the hospital he has cut back to just one 40  ounce beer a day.  HM: He is due for [employer] booster vaccine.  He declines flu shot.  Overdue for colon cancer screening.  Past medical, surgical, family history and social history reviewed. Patient Active Problem List   Diagnosis Date Noted  . Influenza vaccine refused 08/21/2020  . Cardiomyopathy (HCC) 08/04/2020  . CAD (coronary artery disease) 08/04/2020  . Renal insufficiency 08/04/2020  . Chronic combined systolic and diastolic heart failure (HCC) 07/31/2020  . Alcohol use disorder, mild, abuse 07/31/2020  . Tobacco use disorder 07/31/2020  . Hyperlipidemia 07/31/2020  . Acute combined systolic and diastolic heart failure (HCC) 07/26/2020  . Dyspnea on exertion 07/25/2020  . Orthopnea 07/25/2020  . Hypertensive urgency 07/25/2020     Current Outpatient Medications on File Prior to Visit  Medication Sig Dispense Refill  . aspirin EC 81 MG EC tablet Take 1 tablet (81 mg total) by mouth daily. Swallow whole. 30 tablet 11  . atorvastatin (LIPITOR) 80 MG tablet Take 1 tablet (80 mg total) by mouth daily. 30 tablet 6  . carvedilol (COREG) 6.25 MG tablet Take 1 tablet (6.25 mg total) by mouth 2 (two) times daily with a meal. 60 tablet 6  . dapagliflozin propanediol (FARXIGA) 10 MG TABS tablet Take 1 tablet (10 mg total) by mouth daily. 30 tablet 6  . furosemide (LASIX) 40 MG tablet Take 1 tablet (40 mg total) by mouth as needed. For shortness of breath, lower extremity edema  or weight gain. 30 tablet 3  . hydrALAZINE (APRESOLINE) 50 MG tablet Take 1 tablet (50 mg total) by mouth 3 (three) times daily. 90 tablet 6  . isosorbide mononitrate (IMDUR) 30 MG 24 hr tablet Take 1 tablet (30 mg total) by mouth daily. 30 tablet 6  . nicotine (NICODERM CQ) 7 mg/24hr patch Place 1 patch (7 mg total) onto the skin daily. 28 patch 0  . nitroGLYCERIN (NITROSTAT) 0.4 MG SL tablet Place 1 tablet (0.4 mg total) under the tongue every 5 (five) minutes x 3 doses as needed for chest pain. 25 tablet 12   . sacubitril-valsartan (ENTRESTO) 97-103 MG Take 1 tablet by mouth 2 (two) times daily. 60 tablet 11  . spironolactone (ALDACTONE) 25 MG tablet Take 1 tablet (25 mg total) by mouth daily. 30 tablet 11   No current facility-administered medications on file prior to visit.    No Known Allergies  Social History   Socioeconomic History  . Marital status: Single    Spouse name: Not on file  . Number of children: Not on file  . Years of education: Not on file  . Highest education level: Not on file  Occupational History  . Not on file  Tobacco Use  . Smoking status: Former Smoker    Packs/day: 1.50    Years: 40.00    Pack years: 60.00    Types: Cigarettes    Quit date: 07/24/2020    Years since quitting: 0.0  . Smokeless tobacco: Never Used  . Tobacco comment: willing to stop.   Vaping Use  . Vaping Use: Never used  Substance and Sexual Activity  . Alcohol use: Yes    Alcohol/week: 4.0 standard drinks    Types: 4 Cans of beer per week    Comment: 1 40oz can a day, everyday.  . Drug use: Not Currently  . Sexual activity: Not on file  Other Topics Concern  . Not on file  Social History Narrative  . Not on file   Social Determinants of Health   Financial Resource Strain: High Risk  . Difficulty of Paying Living Expenses: Hard  Food Insecurity: Food Insecurity Present  . Worried About Programme researcher, broadcasting/film/video in the Last Year: Sometimes true  . Ran Out of Food in the Last Year: Sometimes true  Transportation Needs: Unmet Transportation Needs  . Lack of Transportation (Medical): Yes  . Lack of Transportation (Non-Medical): Yes  Physical Activity: Insufficiently Active  . Days of Exercise per Week: 4 days  . Minutes of Exercise per Session: 30 min  Stress: Not on file  Social Connections: Not on file  Intimate Partner Violence: Not on file    No family history on file.  Past Surgical History:  Procedure Laterality Date  . RIGHT/LEFT HEART CATH AND CORONARY ANGIOGRAPHY  N/A 07/27/2020   Procedure: RIGHT/LEFT HEART CATH AND CORONARY ANGIOGRAPHY;  Surgeon: Yvonne Kendall, MD;  Location: [facility];  Service: Cardiovascular;  Laterality: N/A;    ROS: Review of Systems Negative except as stated above  PHYSICAL EXAM: BP (!) 177/99   Pulse 82   Resp 16   Ht 5\' 10"  (1.778 m)   Wt 222 lb 6.4 oz (100.9 kg)   SpO2 98%   BMI 31.91 kg/m   Wt Readings from Last 3 Encounters:  08/21/20 222 lb 6.4 oz (100.9 kg)  08/04/20 220 lb 3.2 oz (99.9 kg)  07/31/20 218 lb 6.4 oz (99.1 kg)   Physical Exam  General  appearance - alert, well appearing, middle-aged older African-American male and in no distress Mental status - normal mood, behavior, speech, dress, motor activity, and thought processes Eyes - pupils equal and reactive, extraocular eye movements intact Neck - supple, no significant adenopathy Chest - clear to auscultation, no wheezes, rales or rhonchi, symmetric air entry Heart - normal rate, regular rhythm, normal S1, S2, no murmurs, rubs, clicks or gallops Extremities - peripheral pulses normal, no pedal edema, no clubbing or cyanosis  CMP Latest Ref Rng & Units 07/29/2020 07/27/2020 07/27/2020  Glucose 70 - 99 mg/dL 99 - -  BUN 6 - 20 mg/dL 15 - -  Creatinine 5.92 - 1.24 mg/dL 9.24(M) - -  Sodium 628 - 145 mmol/L 140 141 141  Potassium 3.5 - 5.1 mmol/L 4.0 3.7 3.9  Chloride 98 - 111 mmol/L 106 - -  CO2 22 - 32 mmol/L 24 - -  Calcium 8.9 - 10.3 mg/dL 9.2 - -  Total Protein 6.5 - 8.1 g/dL - - -  Total Bilirubin 0.3 - 1.2 mg/dL - - -  Alkaline Phos 38 - 126 U/L - - -  AST 15 - 41 U/L - - -  ALT 0 - 44 U/L - - -   Lipid Panel     Component Value Date/Time   CHOL 260 (H) 07/25/2020 0809   TRIG 53 07/25/2020 0809   HDL 80 07/25/2020 0809   CHOLHDL 3.3 07/25/2020 0809   VLDL 11 07/25/2020 0809   LDLCALC 169 (H) 07/25/2020 0809    CBC    Component Value Date/Time   WBC 5.0 07/28/2020 0334   RBC 6.37 (H) 07/28/2020 0334   HGB 15.6  07/28/2020 0334   HCT 48.0 07/28/2020 0334   PLT 240 07/28/2020 0334   MCV 75.4 (L) 07/28/2020 0334   MCH 24.5 (L) 07/28/2020 0334   MCHC 32.5 07/28/2020 0334   RDW 16.5 (H) 07/28/2020 0334   LYMPHSABS 1.7 07/24/2020 1324   MONOABS 0.6 07/24/2020 1324   EOSABS 0.1 07/24/2020 1324   BASOSABS 0.0 07/24/2020 1324    ASSESSMENT AND PLAN: 1. Encounter to establish care  2. Essential hypertension Not at goal.  I recommend increasing hydralazine to 75 mg 3 times a day.  I have sent a prescription to the pharmacy for the 25 mg tablet.  He will take 1 with a 50 mg tablet 3 times a day.  Follow-up with the clinical pharmacist in 1 week for recheck of blood pressure. - hydrALAZINE (APRESOLINE) 25 MG tablet; 1 tab PO 3x/day with a 50 mg tab  Dispense: 90 tablet; Refill: 6  3. Chronic combined systolic and diastolic heart failure (HCC) Compensated.  Need for better blood pressure control.  Advised to limit salt in the foods. He is on guideline directed therapy including carvedilol, hydralazine, furosemide, isosorbide, spironolactone and Farxiga.  4. Coronary artery disease of native artery of native heart with stable angina pectoris (HCC) Stable.  Continue atorvastatin, beta-blocker and aspirin.  5. Tobacco dependence Advised to quit.  Discussed health risks associated with smoking.  Patient actively trying to quit.  He would like to try the nicotine gum.  Encouraged him to set a quit date.  Less than 5 minutes spent on counseling. - nicotine polacrilex (NICORETTE) 2 MG gum; Chew 1 piece PRN.  Max 30 pieces/24 hrs  Dispense: 100 tablet; Refill: 2  6. Alcohol use disorder, moderate, dependence (HCC) Encouraged him to cut back even more.  I went over how much is too  much alcoholic beverage per day for a male.  He should not drink no more than two 12 ounce beer a day.  Discussed health risks associated with excessive alcohol use.  7. Stage 3a chronic kidney disease (HCC) Discussed importance of  good blood pressure control to preserve kidney function.  Avoid NSAIDs. - Comprehensive metabolic panel  8. Prediabetes Dietary counseling given.  9. Influenza vaccine refused Patient declined.     Patient was given the opportunity to ask questions.  Patient verbalized understanding of the plan and was able to repeat key elements of the plan.   Orders Placed This Encounter  Procedures  . Comprehensive metabolic panel     Requested Prescriptions   Signed Prescriptions Disp Refills  . hydrALAZINE (APRESOLINE) 25 MG tablet 90 tablet 6    Sig: 1 tab PO 3x/day with a 50 mg tab  . nicotine polacrilex (NICORETTE) 2 MG gum 100 tablet 2    Sig: Chew 1 piece PRN.  Max 30 pieces/24 hrs    Return in about 7 weeks (around 10/09/2020) for Give appt with [facility] in 1 wk for recheck BP.  Jonah Blue, MD, FACP

## 2020-08-22 LAB — COMPREHENSIVE METABOLIC PANEL
ALT: 28 IU/L (ref 0–44)
AST: 27 IU/L (ref 0–40)
Albumin/Globulin Ratio: 1.3 (ref 1.2–2.2)
Albumin: 4.1 g/dL (ref 3.8–4.9)
Alkaline Phosphatase: 87 IU/L (ref 44–121)
BUN/Creatinine Ratio: 18 (ref 9–20)
BUN: 31 mg/dL — ABNORMAL HIGH (ref 6–24)
Bilirubin Total: 0.3 mg/dL (ref 0.0–1.2)
CO2: 20 mmol/L (ref 20–29)
Calcium: 9.2 mg/dL (ref 8.7–10.2)
Chloride: 105 mmol/L (ref 96–106)
Creatinine, Ser: 1.7 mg/dL — ABNORMAL HIGH (ref 0.76–1.27)
Globulin, Total: 3.2 g/dL (ref 1.5–4.5)
Glucose: 92 mg/dL (ref 65–99)
Potassium: 4.7 mmol/L (ref 3.5–5.2)
Sodium: 142 mmol/L (ref 134–144)
Total Protein: 7.3 g/dL (ref 6.0–8.5)
eGFR: 46 mL/min/{1.73_m2} — ABNORMAL LOW (ref >59)

## 2020-08-23 NOTE — Progress Notes (Signed)
Let patient know that his kidney function is not 100% has worsened some since hospitalization.  We will need to continue to monitor kidney function on subsequent visit.  He should avoid taking over-the-counter pain medications like ibuprofen, Aleve, Advil, Motrin and Naprosyn as these can make kidney function worse.  Liver function tests normal.

## 2020-08-24 ENCOUNTER — Telehealth: Payer: Self-pay | Admitting: Internal Medicine

## 2020-08-24 ENCOUNTER — Telehealth: Payer: Self-pay | Admitting: Licensed Clinical Social Worker

## 2020-08-24 ENCOUNTER — Telehealth: Payer: Self-pay

## 2020-08-24 NOTE — Telephone Encounter (Signed)
Tried contacting pt to go over lab results phone went to busy signal

## 2020-08-24 NOTE — Telephone Encounter (Signed)
Ride request sent for pt upcoming echocardiogram at [facility] office to [employer].   Octavio Graves, MSW, LCSW [facility] Health Heart/Vascular Care Navigation  [phone]

## 2020-08-24 NOTE — Telephone Encounter (Signed)
-----   Message from Abelino Derrick, [place] sent at 08/24/2020 10:47 AM EST ----- I will contact the patient and decrease the Aldactone to 12.5 mg and change Lasix to 20 mg PRN.  His B/P was pretty high in the office when I saw him, I would like to try and keep him on the higher dose of Entresto if possible. I'll arrange an office visit to f/u  Thanks  Corine Shelter PA-C 08/24/2020 10:49 AM   ----- Message ----- From: Marcine Matar, MD Sent: 08/23/2020   8:58 PM EST To: Abelino Derrick, PA-C  I recently saw Mr. Gillen as a new patient.  His BMP showed slight worsening of creatinine and GFR.  Based on results, do we need to make any changes in the dose of the Entresto?

## 2020-08-26 ENCOUNTER — Telehealth: Payer: Self-pay | Admitting: *Deleted

## 2020-08-26 NOTE — Telephone Encounter (Signed)
Unable to reach or leave message for pt will try again

## 2020-08-26 NOTE — Telephone Encounter (Signed)
-----   Message from Abelino Derrick, [place] sent at 08/24/2020 10:49 AM EST ----- Please have this patient decrease his Aldactone to 12.5 mg daily and change his lasix to 20 mg PRN. He needs a BMP the end of this week and a f/u with an APP next week for B/P check.  Corine Shelter PA-C 08/24/2020 10:51 AM

## 2020-08-27 ENCOUNTER — Other Ambulatory Visit: Payer: Self-pay

## 2020-08-27 ENCOUNTER — Ambulatory Visit (HOSPITAL_COMMUNITY): Payer: Self-pay | Attending: Cardiology

## 2020-08-27 DIAGNOSIS — I5042 Chronic combined systolic (congestive) and diastolic (congestive) heart failure: Secondary | ICD-10-CM | POA: Insufficient documentation

## 2020-08-27 LAB — ECHOCARDIOGRAM COMPLETE
Area-P 1/2: 3.6 cm2
S' Lateral: 3.4 cm

## 2020-08-27 MED ORDER — PERFLUTREN LIPID MICROSPHERE
1.0000 mL | INTRAVENOUS | Status: AC | PRN
  Administered 2020-08-27: 3 mL via INTRAVENOUS

## 2020-09-01 ENCOUNTER — Telehealth (HOSPITAL_COMMUNITY): Payer: Self-pay

## 2020-09-01 NOTE — Telephone Encounter (Signed)
Contacted [employer] to follow up on patient assistance application submitted for [employer]. He was approved from 07/31/20 through 07/31/21.   Contacted [employer] to follow up on patient assistance application submitted for Farxiga. His application is still pending.  Will continue to follow.

## 2020-09-04 ENCOUNTER — Telehealth: Payer: Self-pay | Admitting: Licensed Clinical Social Worker

## 2020-09-04 NOTE — Telephone Encounter (Signed)
LCSW sent pt a message regarding pay stubs needed for [employer] and [facility]. Pt responded and shared that he has lost his job, he will still try and drop the stubs back by the office. I provided support for pt job loss, let him know we remain available for any ongoing needs during this time.   Dustin Fowler, MSW, LCSW [facility] Health Heart/Vascular Care Navigation  [phone]

## 2020-09-08 ENCOUNTER — Telehealth: Payer: Self-pay | Admitting: Licensed Clinical Social Worker

## 2020-09-08 NOTE — Telephone Encounter (Signed)
Contacted [employer]Me to follow up on patient assistance application submitted for Farxiga. They are now requiring for him to send his most recent income documentation (IRS 1040 form).  Attempted call to pt but had to leave voicemail.  Will await call back.

## 2020-09-08 NOTE — Telephone Encounter (Addendum)
LCSW f/u again with pt regarding pay stubs, assistance applications are pending multiple missing pieces of documentation. Currently Med[employer] application missing past pay stubs. Pt gave me permission to call his former employer. I have reached out to Jennette Kettle at [employer] and he requests I call back in the morning as the accounting department (his aunts/family business) are currently gone for the day. I will call back in the morning. Pt aware of above. Also noted that [facility], PharmD, is attempting to help w/ [place] application- pt did file taxes last year but does not have 1040, a transcript request was mailed to IRS but pt shares he has not received it yet. I will reach back out to [facility] [facility] [employer] in order to get pay stubs for [place] MedAssist application.   Octavio Graves, MSW, LCSW [facility] Heart/Vascular Care Navigation  [phone]

## 2020-09-09 ENCOUNTER — Telehealth: Payer: Self-pay | Admitting: Licensed Clinical Social Worker

## 2020-09-09 NOTE — Telephone Encounter (Signed)
LCSW called Nurse, children's. I was able to obtain pt payroll for assistance applications from [employer], accounts. LCSW will f/u with pt if any additional needs arise; await additional needed paperwork for assistance applications.   Octavio Graves, MSW, LCSW [facility] Health Heart/Vascular Care Navigation  [phone]

## 2020-09-10 ENCOUNTER — Telehealth: Payer: Self-pay | Admitting: Licensed Clinical Social Worker

## 2020-09-10 ENCOUNTER — Other Ambulatory Visit: Payer: Self-pay

## 2020-09-10 DIAGNOSIS — I10 Essential (primary) hypertension: Secondary | ICD-10-CM

## 2020-09-10 MED FILL — SPIRONOLACTONE 25 MG TABLET: 25 | 30 days supply | Qty: 30 | Fill #1

## 2020-09-10 NOTE — Progress Notes (Signed)
Heart and Vascular Care Navigation  09/10/2020  Dustin Fowler 09/06/1960 [id number]  Reason for Referral:  F/u on medication assistance/ pt employment challenges. Engaged with patient by telephone for follow up visit for Heart and Vascular Care Coordination.                                                                                                   Assessment:    LCSW spoke with pt this morning, I clarified for him which medications would be sent from [facility] and which ones would come from [facility] and the Patient Assistance applications. Pt also able to get aspirin from our sample closet and that has been placed at the front desk. Pt will be able to afford the two medications and nicotine patches from [facility] pharmacy. I have spoken with [facility] and Demaris Callander w/ [facility] team to have medications sent to the appropriate pharmacies at this time. Pt has my number and additional numbers as needed moving forward.                                     HRT/VAS Care Coordination    Patients Home Cardiology Office [facility]   Outpatient Care Team Social Worker   Social Worker Name: Octavio Graves- [facility]- [phone]   Living arrangements for the past 2 months [employer]  for past 3 years   Lives with: Self   Patient Current Insurance Coverage Self-Pay   Patient Has Concern With Paying Medical Bills Yes   Patient Concerns With Medical Bills no insurance and recent hospital stay- screened and found ineligible for Medicaid by [facility] Referrals: CAFA   Does Patient Have Prescription Coverage? No   Patient Prescription Assistance Programs Patient Assistance Programs; [place] [facility]   [place] [facility] Medications provided application 07/31/20   Patient Assistance Programs Medications [employer] for [employer] and [employer]Me for [employer]   DME Agency NA      Social History:                                                                             SDOH Screenings    Alcohol Screen: Low Risk   . Last Alcohol Screening Score (AUDIT): 6  Depression (PHQ2-9): Low Risk   . PHQ-2 Score: 0  Financial Resource Strain: High Risk  . Difficulty of Paying Living Expenses: Hard  Food Insecurity: Food Insecurity Present  . Worried About Programme researcher, broadcasting/film/video in the Last Year: Sometimes true  . Ran Out of Food in the Last Year: Sometimes true  Housing: Low Risk   . Last Housing Risk Score: 0  Physical Activity: Insufficiently Active  . Days of Exercise per Week: 4 days  . Minutes of Exercise  per Session: 30 min  Social Connections: Not on file  Stress: Not on file  Tobacco Use: Medium Risk  . Smoking Tobacco Use: Former Smoker  . Smokeless Tobacco Use: Never Used  Transportation Needs: Unmet Transportation Needs  . Lack of Transportation (Medical): Yes  . Lack of Transportation (Non-Medical): Yes   Other Care Navigation Interventions:     Provided Pharmacy assistance resources [place] [facility]ist; [facility]   Follow-up plan:   LCSW sent medications to [place], [place], who is in triage today and willing to assist w/ having medications sent to appropriate pharmacies. Pt will be left aspirin samples at the front desk. LCSW will f/u with pt next week to ensure meds arrived from [facility]. Pt has my number, [facility] pharmacy and [facility] pharmacy number to use as needed.

## 2020-09-11 ENCOUNTER — Other Ambulatory Visit: Payer: Self-pay | Admitting: Internal Medicine

## 2020-09-11 ENCOUNTER — Telehealth: Payer: Self-pay

## 2020-09-11 DIAGNOSIS — I428 Other cardiomyopathies: Secondary | ICD-10-CM

## 2020-09-11 DIAGNOSIS — I5042 Chronic combined systolic (congestive) and diastolic (congestive) heart failure: Secondary | ICD-10-CM

## 2020-09-11 MED ORDER — CARVEDILOL 6.25 MG PO TABS: 6.2500 mg | ORAL_TABLET | Freq: Two times a day (BID) | ORAL | 3 refills | Status: DC

## 2020-09-11 MED ORDER — NITROGLYCERIN 0.4 MG SL SUBL: 0.4000 mg | SUBLINGUAL_TABLET | SUBLINGUAL | 6 refills | Status: DC | PRN

## 2020-09-11 MED ORDER — SPIRONOLACTONE 25 MG PO TABS
25.0000 mg | ORAL_TABLET | Freq: Every day | ORAL | 3 refills | Status: DC
  Filled 2021-04-13: qty 30, 30d supply, fill #0

## 2020-09-11 MED ORDER — HYDRALAZINE HCL 50 MG PO TABS: 50.0000 mg | ORAL_TABLET | Freq: Three times a day (TID) | ORAL | 3 refills | Status: DC

## 2020-09-11 MED ORDER — FUROSEMIDE 40 MG PO TABS: 40.0000 mg | ORAL_TABLET | ORAL | 6 refills | Status: DC | PRN

## 2020-09-11 MED ORDER — NICOTINE 7 MG/24HR TD PT24: 7.0000 mg | MEDICATED_PATCH | Freq: Every day | TRANSDERMAL | 1 refills | Status: DC

## 2020-09-11 MED ORDER — ISOSORBIDE MONONITRATE ER 30 MG PO TB24: 30.0000 mg | ORAL_TABLET | Freq: Every day | ORAL | 3 refills | Status: DC

## 2020-09-11 MED ORDER — HYDRALAZINE HCL 25 MG PO TABS: ORAL_TABLET | ORAL | 3 refills | Status: DC

## 2020-09-11 MED ORDER — ATORVASTATIN CALCIUM 80 MG PO TABS: 80.0000 mg | ORAL_TABLET | Freq: Every day | ORAL | 3 refills | Status: DC

## 2020-09-11 NOTE — Telephone Encounter (Signed)
Attempted to call patient in regards to further testing per Dr. Jacques Navy.   Dr. Jacques Navy would like for patient to have Cardiac MRI instead of repeat Echo if okay with patient.   Left message for patient to call back to office when able.

## 2020-09-15 ENCOUNTER — Telehealth: Payer: Self-pay | Admitting: Licensed Clinical Social Worker

## 2020-09-15 NOTE — Telephone Encounter (Signed)
Pt called this Clinical research associate and shared that he received a letter from [facility] stating that it will take 10 days for him to get his medications. LCSW attempted to reach [facility] pharmacy but it was closed for lunch, pt unable to call as he is at a new job currently. LCSW will re-attempt to reach pharmacy for clarity once they have reopened from lunch and will text patient with update once received.   Dustin Fowler, MSW, LCSW [facility] Health Heart/Vascular Care Navigation  [phone]

## 2020-09-15 NOTE — Telephone Encounter (Signed)
Called and spoke with [facility], medications were mailed yesterday.  I then sent text to patient letting him know medications were shipped and reminding him that he has samples of aspirin waiting for him at our office if he has not yet picked them up. Remain available for pt ongoing questions/concerns.   Octavio Graves, MSW, LCSW [facility] Health Heart/Vascular Care Navigation  [phone]

## 2020-09-15 NOTE — Telephone Encounter (Signed)
Attempted to call patient, left message for patient to call back to office.   

## 2020-09-17 ENCOUNTER — Telehealth: Payer: Self-pay | Admitting: Licensed Clinical Social Worker

## 2020-09-17 NOTE — Telephone Encounter (Signed)
LCSW received a call from pt. He is frustrated since he was told his medications still hadn't arrived today. LCSW assisted w/ calling [facility]- they confirm meds had been delivered. Pt insistent he had been told they were not there. Inquired if I could call the [facility] front desk and ask- pt agreed. LCSW able to call and reach front desk, they confirm medications are there. Pt also made aware samples of aspirin are still at our front desk. I have confirmed they are still there and stapled a list of pt medications and where they are being filled along w/ information about where pt should be getting his medications and how to request/call for refills.  I also connected Pharmacy HF Navigator Megan pt pay stubs as she has been given the green light to submit those to [employer]Me for [facility] patient assistance.   Octavio Graves, MSW, LCSW [facility] Health Heart/Vascular Care Navigation  [phone]

## 2020-09-18 NOTE — Telephone Encounter (Signed)
Income verification documentation was faxed to [employer] on 3/31. Contacted the program today and they have received the fax. Determination to follow in 24-48 hours.

## 2020-09-23 ENCOUNTER — Telehealth: Payer: Self-pay | Admitting: Internal Medicine

## 2020-09-23 NOTE — Telephone Encounter (Signed)
Spoke with patient, advised patient of Dr. Lupe Carney recommendations for Cardiac MRI vs. Echo. Patient would like to proceed with the Cardiac MRI. Advised patient that order has been placed and that someone from scheduling will reach out to get him scheduled for this.   Advised patient to call back to office with any issues, questions, or concerns. Patient verbalized understanding.

## 2020-09-23 NOTE — Addendum Note (Signed)
Addended by: Bea Laura B on: 09/23/2020 03:55 PM   Modules accepted: Orders

## 2020-09-23 NOTE — Telephone Encounter (Signed)
Left message for patient to call and discuss scheduling the Cardiac MRI ordered by Dr. Acharya 

## 2020-09-24 NOTE — Telephone Encounter (Signed)
Left message for patient to call and discuss scheduling the Cardiac MRI ordered by Dr. Acharya 

## 2020-09-28 NOTE — Telephone Encounter (Signed)
Spoke with patient regarding preferred scheduling weekdays and times for the Cardiac MRI ordere dby Dr. Jacques Navy.  Informed patient I would call back with his appointment.  He voiced his understanding.

## 2020-09-30 NOTE — Telephone Encounter (Signed)
Contacted [employer] to follow up on application. He has been approved from 09/17/20-09/17/21. He will receive the shipment shortly. LVM with patient.

## 2020-10-01 ENCOUNTER — Other Ambulatory Visit: Payer: Self-pay

## 2020-10-01 ENCOUNTER — Telehealth: Payer: Self-pay | Admitting: Licensed Clinical Social Worker

## 2020-10-01 ENCOUNTER — Encounter: Payer: Self-pay | Admitting: Internal Medicine

## 2020-10-01 MED FILL — Sacubitril-Valsartan Tab 97-103 MG: ORAL | 30 days supply | Qty: 60 | Fill #0 | Status: CN

## 2020-10-01 MED FILL — Spironolactone Tab 25 MG: ORAL | 30 days supply | Qty: 30 | Fill #0 | Status: CN

## 2020-10-01 MED FILL — Dapagliflozin Propanediol Tab 10 MG (Base Equivalent): ORAL | 30 days supply | Qty: 30 | Fill #0 | Status: CN

## 2020-10-01 NOTE — Telephone Encounter (Signed)
LCSW received a call from pt stating he has questions about his food stamps. LCSW called pt back, no answer, voicemail left at [phone] sharing DSS number. I also have texted pt the [facility] DSS number and the [facility] number if he does not receive my voicemail.   Octavio Graves, MSW, LCSW [facility] Health Heart/Vascular Care Navigation  [phone]

## 2020-10-01 NOTE — Telephone Encounter (Signed)
Left message for patient regarding the 10/19/20 9:00 am Cardiac MRI appointment at [facility]----arrival time is 8:30 am---1st floor admissions office for check in ---will mail information to patient and requested he call with questions or concerns.

## 2020-10-08 ENCOUNTER — Other Ambulatory Visit: Payer: Self-pay

## 2020-10-16 ENCOUNTER — Telehealth (HOSPITAL_COMMUNITY): Payer: Self-pay | Admitting: Emergency Medicine

## 2020-10-16 NOTE — Telephone Encounter (Signed)
Attempted to call patient regarding upcoming cardiac MR appointment. Left message on voicemail with name and callback number Diar Berkel RN Navigator Cardiac Imaging [facility] [phone] Office [phone] Cell  

## 2020-10-19 ENCOUNTER — Other Ambulatory Visit: Payer: Self-pay

## 2020-10-19 ENCOUNTER — Ambulatory Visit (HOSPITAL_COMMUNITY)
Admission: RE | Admit: 2020-10-19 | Discharge: 2020-10-19 | Disposition: A | Discharge status: Home / Self Care | Payer: Self-pay | Source: Ambulatory Visit | Attending: Internal Medicine | Admitting: Internal Medicine

## 2020-10-19 DIAGNOSIS — I5042 Chronic combined systolic (congestive) and diastolic (congestive) heart failure: Secondary | ICD-10-CM | POA: Insufficient documentation

## 2020-10-19 DIAGNOSIS — I428 Other cardiomyopathies: Secondary | ICD-10-CM | POA: Insufficient documentation

## 2020-10-19 MED ORDER — GADOBUTROL 1 MMOL/ML IV SOLN
10.0000 mL | Freq: Once | INTRAVENOUS | Status: AC | PRN
  Administered 2020-10-19: 10 mL via INTRAVENOUS

## 2020-10-19 MED FILL — Sacubitril-Valsartan Tab 97-103 MG: ORAL | 30 days supply | Qty: 60 | Fill #0 | Status: AC

## 2020-10-19 MED FILL — Spironolactone Tab 25 MG: ORAL | 30 days supply | Qty: 30 | Fill #0 | Status: AC

## 2020-10-23 ENCOUNTER — Other Ambulatory Visit (HOSPITAL_COMMUNITY): Payer: Self-pay

## 2020-10-26 ENCOUNTER — Other Ambulatory Visit: Payer: Self-pay

## 2020-10-26 ENCOUNTER — Telehealth: Payer: Self-pay | Admitting: Licensed Clinical Social Worker

## 2020-10-26 DIAGNOSIS — I422 Other hypertrophic cardiomyopathy: Secondary | ICD-10-CM

## 2020-10-26 NOTE — Telephone Encounter (Signed)
LCSW received call from pt- he shares he received some mail (sounds like the f/u assistance packets I had mailed). He will bring it to his appt on 5/20 and we can review it. Pt has changed jobs therefore will need to collect/submit new documentation.    Dustin Fowler, MSW, LCSW [facility] Health Heart/Vascular Care Navigation  [phone]

## 2020-11-03 NOTE — Progress Notes (Deleted)
Cardiology Office Note:    Date:  11/03/2020   ID:  Dustin Fowler, DOB Jan 27, 1961, MRN [id number]  PCP:  Pcp, No  Cardiologist:  Parke Poisson, MD  Electrophysiologist:  None   Referring MD: No ref. provider found   Chief Complaint/Reason for Referral: ***  History of Present Illness:    Dustin Fowler is a 60 y.o. male with a history of ***  Genetics referral-   No past medical history on file.  Past Surgical History:  Procedure Laterality Date  . RIGHT/LEFT HEART CATH AND CORONARY ANGIOGRAPHY N/A 07/27/2020   Procedure: RIGHT/LEFT HEART CATH AND CORONARY ANGIOGRAPHY;  Surgeon: Yvonne Kendall, MD;  [facility];  Service: Cardiovascular;  Laterality: N/A;    Current Medications: No outpatient medications have been marked as taking for the 11/06/20 encounter (Appointment) with Parke Poisson, MD.     Allergies:   Patient has no known allergies.   Social History   Tobacco Use  . Smoking status: Former Smoker    Packs/day: 1.50    Years: 40.00    Pack years: 60.00    Types: Cigarettes    Quit date: 07/24/2020    Years since quitting: 0.2  . Smokeless tobacco: Never Used  . Tobacco comment: willing to stop.   Vaping Use  . Vaping Use: Never used  Substance Use Topics  . Alcohol use: Yes    Alcohol/week: 4.0 standard drinks    Types: 4 Cans of beer per week    Comment: 1 40oz can a day, everyday.  . Drug use: Not Currently     Family History: The patient's family history is not on file.  ROS:   Please see the history of present illness.    All other systems reviewed and are negative.  EKGs/Labs/Other Studies Reviewed:    The following studies were reviewed today:  EKG:  ***  I have independently reviewed the images from ***.  Recent Labs: 07/24/2020: B Natriuretic Peptide 330.7 07/25/2020: TSH 1.607 07/28/2020: Hemoglobin 15.6; Platelets 240 08/21/2020: ALT 28; BUN 31; Creatinine, Ser 1.70; Potassium 4.7; Sodium 142  Recent  Lipid Panel    Component Value Date/Time   CHOL 260 (H) 07/25/2020 0809   TRIG 53 07/25/2020 0809   HDL 80 07/25/2020 0809   CHOLHDL 3.3 07/25/2020 0809   VLDL 11 07/25/2020 0809   LDLCALC 169 (H) 07/25/2020 0809    Physical Exam:    VS:  There were no vitals taken for this visit.    Wt Readings from Last 5 Encounters:  08/21/20 222 lb 6.4 oz (100.9 kg)  08/04/20 220 lb 3.2 oz (99.9 kg)  07/31/20 218 lb 6.4 oz (99.1 kg)  07/29/20 212 lb 4.8 oz (96.3 kg)    Constitutional: No acute distress Eyes: sclera non-icteric, normal conjunctiva and lids ENMT: normal dentition, moist mucous membranes Cardiovascular: regular rhythm, normal rate, no murmurs. S1 and S2 normal. Radial pulses normal bilaterally. No jugular venous distention.  Respiratory: clear to auscultation bilaterally GI : normal bowel sounds, soft and nontender. No distention.   MSK: extremities warm, well perfused. No edema.  NEURO: grossly nonfocal exam, moves all extremities. PSYCH: alert and oriented x 3, normal mood and affect.   ASSESSMENT:    No diagnosis found. PLAN:    No diagnosis found.  Total time of encounter: *** minutes total time of encounter, including *** minutes spent in face-to-face patient care on the date of this encounter. This time includes coordination of  care and counseling regarding above mentioned problem list. Remainder of non-face-to-face time involved reviewing chart documents/testing relevant to the patient encounter and documentation in the medical record. I have independently reviewed documentation from referring provider.   Weston Brass, MD, [facility]  [facility]   Shared Decision Making/Informed Consent:   {Are you ordering a CV Procedure (e.g. stress test, cath, DCCV, TEE, etc)?   Press F2        :[id number]}   Medication Adjustments/Labs and Tests Ordered: Current medicines are reviewed at length with the patient today.  Concerns regarding medicines are outlined  above.   No orders of the defined types were placed in this encounter.   No orders of the defined types were placed in this encounter.   There are no Patient Instructions on file for this visit.   @provider @

## 2020-11-05 ENCOUNTER — Telehealth: Payer: Self-pay | Admitting: Licensed Clinical Social Worker

## 2020-11-05 NOTE — Telephone Encounter (Signed)
Pt called wanting to change his appointment tomorrow w/ Dr. Jacques Navy as he is trying to get some overtime hours at work. I provided him with the number for scheduling and encouraged him to call and get that changed since I am not able to access scheduling/provider calendars.  Pt states he will call today.   Octavio Graves, MSW, LCSW [facility] Health Heart/Vascular Care Navigation  [phone]

## 2020-11-05 NOTE — Telephone Encounter (Signed)
Pt called again upset that he cannot get through to the office. I shared that I still cannot reschedule his appointment. I was able to reach Dr. Lupe Carney nurse Cleotilde Neer, RN, who will contact pt.  Appreciate her assistance.  Octavio Graves, MSW, LCSW [facility] Health Heart/Vascular Care Navigation  [phone]

## 2020-11-06 ENCOUNTER — Ambulatory Visit: Payer: Self-pay | Admitting: Internal Medicine

## 2020-11-17 ENCOUNTER — Encounter: Payer: Self-pay | Admitting: Internal Medicine

## 2020-11-23 ENCOUNTER — Other Ambulatory Visit (HOSPITAL_COMMUNITY): Payer: Self-pay

## 2020-11-23 ENCOUNTER — Other Ambulatory Visit: Payer: Self-pay

## 2020-11-23 MED FILL — Sacubitril-Valsartan Tab 97-103 MG: ORAL | 30 days supply | Qty: 60 | Fill #1 | Status: CN

## 2020-11-23 MED FILL — Nitroglycerin SL Tab 0.4 MG: SUBLINGUAL | 25 days supply | Qty: 25 | Fill #0 | Status: AC

## 2020-11-23 MED FILL — Spironolactone Tab 25 MG: ORAL | 30 days supply | Qty: 30 | Fill #1 | Status: AC

## 2020-11-24 ENCOUNTER — Other Ambulatory Visit: Payer: Self-pay

## 2020-11-26 ENCOUNTER — Institutional Professional Consult (permissible substitution): Payer: Self-pay | Admitting: Cardiology

## 2020-12-04 ENCOUNTER — Ambulatory Visit: Payer: Self-pay | Admitting: Internal Medicine

## 2020-12-04 ENCOUNTER — Telehealth: Payer: Self-pay | Admitting: Licensed Clinical Social Worker

## 2020-12-04 NOTE — Telephone Encounter (Signed)
LCSW received call from pt regarding SNAP benefits. He started receiving them in February, is curious when/if he will need to recertify. I shared that he will receive mail when that time comes but if he is concerned he can reach out directly to DSS to f/u on when that date is and obtain his case worker's name. Pt aware of upcoming appointments. I will resend CAFA Administrator, arts) through [employer] to [place], RN, w/ Dr Lalla Brothers to provide during appointment next week. Pt has my number for any additional questions/concerns.   Dustin Fowler, MSW, LCSW [facility] Health Heart/Vascular Care Navigation  [phone]

## 2020-12-11 ENCOUNTER — Ambulatory Visit (INDEPENDENT_AMBULATORY_CARE_PROVIDER_SITE_OTHER): Payer: Self-pay | Admitting: Cardiology

## 2020-12-11 ENCOUNTER — Telehealth: Payer: Self-pay | Admitting: Licensed Clinical Social Worker

## 2020-12-11 ENCOUNTER — Other Ambulatory Visit: Payer: Self-pay

## 2020-12-11 VITALS — BP 168/88 | HR 83 | Ht 70.0 in | Wt 235.8 lb

## 2020-12-11 DIAGNOSIS — I5042 Chronic combined systolic (congestive) and diastolic (congestive) heart failure: Secondary | ICD-10-CM

## 2020-12-11 DIAGNOSIS — I422 Other hypertrophic cardiomyopathy: Secondary | ICD-10-CM

## 2020-12-11 DIAGNOSIS — I10 Essential (primary) hypertension: Secondary | ICD-10-CM

## 2020-12-11 NOTE — Progress Notes (Signed)
Electrophysiology Office Note:    Date:  12/11/2020   ID:  Dustin Fowler, DOB 1961/03/15, MRN [id number]  PCP:  Oneita Hurt, No  [facility] Cardiologist:  Parke Poisson, MD  [facility] Electrophysiologist:  Lanier Prude, MD   Referring MD: Parke Poisson, MD   Chief Complaint: Hypertrophic cardiomyopathy  History of Present Illness:    Dustin Fowler is a 60 y.o. male who presents for an evaluation of hypertrophic cardiomyopathy at the request of Dr. Jacques Navy. Their medical history includes chronic combined systolic and diastolic heart failure, hypertension.  He has no family history of hypertrophic cardiomyopathy.  No family history of sudden cardiac death.  No family history of defibrillators.  He has a daughter who is healthy.  He was last seen by Dustin Fowler on 10/02/2020.  This was in the setting of a February 2022 hospitalization for cardiomyopathy.  At the time of his admission his ejection fraction was measured at 35 to 40% with severe left atrial dilation and right ventricular dysfunction.  He had mild coronary artery disease on catheterization but this was not felt to be the cause of his cardiomyopathy.  Since discharge, the patient tells me he has been taking his medications.  No syncope or presyncope.  No past medical history on file.  Past Surgical History:  Procedure Laterality Date   RIGHT/LEFT HEART CATH AND CORONARY ANGIOGRAPHY N/A 07/27/2020   Procedure: RIGHT/LEFT HEART CATH AND CORONARY ANGIOGRAPHY;  Surgeon: Yvonne Kendall, MD;  [facility];  Service: Cardiovascular;  Laterality: N/A;    Current Medications: Current Meds  Medication Sig   aspirin 81 MG EC tablet TAKE 1 TABLET (81 MG TOTAL) BY MOUTH DAILY. SWALLOW WHOLE.   atorvastatin (LIPITOR) 80 MG tablet Take 1 tablet (80 mg total) by mouth daily.   carvedilol (COREG) 6.25 MG tablet Take 1 tablet (6.25 mg total) by mouth 2 (two) times daily with a meal.   dapagliflozin  propanediol (FARXIGA) 10 MG TABS tablet Take 1 tablet (10 mg total) by mouth daily.   furosemide (LASIX) 40 MG tablet Take 1 tablet (40 mg total) by mouth as needed. For shortness of breath, lower extremity edema or weight gain.   hydrALAZINE (APRESOLINE) 25 MG tablet TAKE 1 TABLET BY MOUTH 3 TIMES DAILY WITH A 50MG  TABLET.   hydrALAZINE (APRESOLINE) 50 MG tablet Take 1 tablet (50 mg total) by mouth 3 (three) times daily.   isosorbide mononitrate (IMDUR) 30 MG 24 hr tablet TAKE 1 TABLET (30 MG TOTAL) BY MOUTH DAILY.   nitroGLYCERIN (NITROSTAT) 0.4 MG SL tablet Place 1 tablet (0.4 mg total) under the tongue every 5 (five) minutes x 3 doses as needed for chest pain.   sacubitril-valsartan (ENTRESTO) 97-103 MG TAKE 1 TABLET BY MOUTH TWO TIMES DAILY.   spironolactone (ALDACTONE) 25 MG tablet Take 1 tablet (25 mg total) by mouth daily.     Allergies:   Patient has no known allergies.   Social History   Socioeconomic History   Marital status: Single    Spouse name: Not on file   Number of children: Not on file   Years of education: Not on file   Highest education level: Not on file  Occupational History   Not on file  Tobacco Use   Smoking status: Former    Packs/day: 1.50    Years: 40.00    Pack years: 60.00    Types: Cigarettes    Quit date: 07/24/2020    Years  since quitting: 0.3   Smokeless tobacco: Never   Tobacco comments:    willing to stop.   Vaping Use   Vaping Use: Never used  Substance and Sexual Activity   Alcohol use: Yes    Alcohol/week: 4.0 standard drinks    Types: 4 Cans of beer per week    Comment: 1 40oz can a day, everyday.   Drug use: Not Currently   Sexual activity: Not on file  Other Topics Concern   Not on file  Social History Narrative   ** Merged History Encounter **       Social Determinants of Health   Financial Resource Strain: High Risk   Difficulty of Paying Living Expenses: Hard  Food Insecurity: Food Insecurity Present   Worried About  Programme researcher, broadcasting/film/video in the Last Year: Sometimes true   Barista in the Last Year: Sometimes true  Transportation Needs: Personal assistant (Medical): Yes   Lack of Transportation (Non-Medical): Yes  Physical Activity: Insufficiently Active   Days of Exercise per Week: 4 days   Minutes of Exercise per Session: 30 min  Stress: Not on file  Social Connections: Not on file     Family History: The patient's family history is not on file.  ROS:   Please see the history of present illness.    All other systems reviewed and are negative.  EKGs/Labs/Other Studies Reviewed:    The following studies were reviewed today:  Oct 25, 2020 cardiac MRI personally reviewed 1. Hypertrophic cardiomyopathy, variant morphology with diffuse hypertrophy. See findings. Maximal wall thickness 28 mm in the basal anterior and anterolateral and mid lateral wall.  2. No LV apical pouch. No LVOT obstruction. No systolic anterior motion of the mitral valve.  3. Mild-moderately reduced LV systolic function with normal LV chamber size, LVEF 42%. Mild inferior wall hypokinesis. Mild abnormal septal motion at the basal septum, likely secondary to conduction delay.  4. Diffuse post contrast delayed myocardial enhancement from base to apex, most prominent in areas of myocardial hypertrophy, visually >15% of myocardial mass. Focal delayed enhancement at the apex in the anterior wall.  5. Mildly reduced right ventricular systolic function, normal right ventricular chamber size. RVEF 43%.    August 27, 2020 echo personally reviewed Left ventricular function moderately reduced, 40% Right ventricular function normal Moderately dilated left atrium Trivial MR     EKG:  The ekg ordered today demonstrates sinus rhythm.  LVH.  Recent Labs: 07/24/2020: B Natriuretic Peptide 330.7 07/25/2020: TSH 1.607 07/28/2020: Hemoglobin 15.6; Platelets 240 08/21/2020: ALT 28; BUN 31;  Creatinine, Ser 1.70; Potassium 4.7; Sodium 142  Recent Lipid Panel    Component Value Date/Time   CHOL 260 (H) 07/25/2020 0809   TRIG 53 07/25/2020 0809   HDL 80 07/25/2020 0809   CHOLHDL 3.3 07/25/2020 0809   VLDL 11 07/25/2020 0809   LDLCALC 169 (H) 07/25/2020 0809    Physical Exam:    VS:  BP (!) 168/88   Pulse 83   Ht 5\' 10"  (1.778 m)   Wt 235 lb 12.8 oz (107 kg)   SpO2 95%   BMI 33.83 kg/m    Recheck blood pressure 138/78.    Wt Readings from Last 3 Encounters:  12/11/20 235 lb 12.8 oz (107 kg)  08/21/20 222 lb 6.4 oz (100.9 kg)  08/04/20 220 lb 3.2 oz (99.9 kg)     GEN:  Well nourished, well developed in no acute  distress.  Obese HEENT: Normal NECK: No JVD; No carotid bruits LYMPHATICS: No lymphadenopathy CARDIAC: RRR, no murmurs, rubs, gallops RESPIRATORY:  Clear to auscultation without rales, wheezing or rhonchi  ABDOMEN: Soft, non-tender, non-distended MUSCULOSKELETAL:  No edema; No deformity  SKIN: Warm and dry NEUROLOGIC:  Alert and oriented x 3 PSYCHIATRIC:  Normal affect   ASSESSMENT:    1. Hypertrophic cardiomyopathy (HCC)   2. Essential hypertension   3. Chronic combined systolic and diastolic heart failure (HCC)    PLAN:    In order of problems listed above:  1. Hypertrophic cardiomyopathy (HCC) The patient has severe hypertrophic cardiomyopathy with a maximal wall thickness of 28 mm.  He does not have a personal history of sudden cardiac death or significant ventricular arrhythmias that we are aware of.  He has no family history that he is aware of of sudden cardiac death or defibrillators.  He does have a reduced ejection fraction.  Given the above risk factors, his risk for sudden cardiac death from the AHA HCM sudden cardiac death calculator at 5 years is at least 1.6% although this is likely underestimated given the degree of late gadolinium enhancement seen on his cardiac MRI.  I do think there is a clear indication for defibrillator  given his cardiac MRI findings and reduced ejection fraction.  He is currently applying for emergency Medicaid.  Once this is processed and approved, we will get him scheduled for a defibrillator.  I would also like to get him referred to our genetics team for evaluation of specific genetic causes of his hypertrophic cardiomyopathy.  During today's visit, we had an extensive discussion about the ICD implant surgery including the risks, recovery time. He would like to proceed once his Medicaid is approved.  2. Essential hypertension Repeat manual blood pressure 138/78.  He should continue his current medication regimen including Entresto, spironolactone, hydralazine, Imdur, Lasix and Coreg.  3. Chronic combined systolic and diastolic heart failure (HCC) NYHA class II today.  Warm and dry.  Continue Coreg, Farxiga, Lasix, hydralazine, Imdur, Entresto, spironolactone.    Follow-up in 4 months or sooner if his Medicaid is approved and we can proceed with ICD implant.    Medication Adjustments/Labs and Tests Ordered: Current medicines are reviewed at length with the patient today.  Concerns regarding medicines are outlined above.  No orders of the defined types were placed in this encounter.  No orders of the defined types were placed in this encounter.    Signed, Rossie Muskrat. Lalla Brothers, MD, [facility], [facility] 12/11/2020 6:17 PM    Electrophysiology [facility]

## 2020-12-11 NOTE — Patient Instructions (Addendum)
Medication Instructions:  Your physician recommends that you continue on your current medications as directed. Please refer to the Current Medication list given to you today. *If you need a refill on your cardiac medications before your next appointment, please call your pharmacy*  Lab Work: None ordered. If you have labs (blood work) drawn today and your tests are completely normal, you will receive your results only by: MyChart Message (if you have MyChart) OR A paper copy in the mail If you have any lab test that is abnormal or we need to change your treatment, we will call you to review the results.  Testing/Procedures: None ordered.  Follow-Up: At [facility], you and your health needs are our priority.  As part of our continuing mission to provide you with exceptional heart care, we have created designated Provider Care Teams.  These Care Teams include your primary Cardiologist (physician) and Advanced Practice Providers (APPs -  Physician Assistants and Nurse Practitioners) who all work together to provide you with the care you need, when you need it.  Your next appointment:   Your physician wants you to follow-up in: 4 months with Dr. Lalla Brothers.     April 02, 2021 at 1:45 pm

## 2020-12-11 NOTE — Telephone Encounter (Signed)
LCSW sent a text to pt reminding him of appt today at [facility] office and provided him with [employer] department number for ride. Let pt know that Boneta Lucks, RN, has application to provide to pt. I requested that he let me know if there is a time Monday-Thursday and I can review/assist pt with completing what we could prior to pt gathering appropriate documents. Pt enrolled in [place] PAP assistance and [employer].   Octavio Graves, MSW, LCSW [facility] Health Heart/Vascular Care Navigation  [phone]

## 2020-12-14 ENCOUNTER — Telehealth: Payer: Self-pay | Admitting: Licensed Clinical Social Worker

## 2020-12-14 NOTE — Telephone Encounter (Signed)
LCSW received a call from pt, he shares he took home application and noted it had been started by this Clinical research associate. I confirmed that his demographic information had been completed however pt needs to complete the portions of the application that were noted and gather supporting documentation. Pt asked a few clarifying questions which were answered to the best of my ability. I explained that it may be more helpful once he thinks he has gathered all documents from new job etc that he get a time to meet with me, he is aware I will f/u again by the end of the week for any questions that may arise.   Octavio Graves, MSW, LCSW [facility] Health Heart/Vascular Care Navigation  [phone]

## 2020-12-30 ENCOUNTER — Telehealth: Payer: Self-pay | Admitting: Licensed Clinical Social Worker

## 2020-12-30 NOTE — Telephone Encounter (Signed)
Pt contacted this Clinical research associate, he shares he has application from appointment two weeks ago but hasn't started it because he "has been busy." I encouraged pt to try and take some time to complete this when he has time. Pt inquired about bringing it by office on Friday. LCSW agreeable to this and reviewing it with him, pt unclear about time he could come. I requested he let me know Friday morning and give me at least an hour heads up so I can accommodate meeting with him.   Dustin Fowler, MSW, LCSW [facility] Health Heart/Vascular Care Navigation  [phone]

## 2020-12-31 ENCOUNTER — Telehealth: Payer: Self-pay | Admitting: Licensed Clinical Social Worker

## 2020-12-31 NOTE — Telephone Encounter (Signed)
LCSW received a call from pt, he states he had the 1040 sent to his phone and plans to come to work on paperwork with this Clinical research associate tomorrow at [employer].   Dustin Fowler, MSW, LCSW [facility] Health Heart/Vascular Care Navigation  [phone]

## 2021-01-01 ENCOUNTER — Telehealth: Payer: Self-pay | Admitting: Licensed Clinical Social Worker

## 2021-01-01 NOTE — Telephone Encounter (Signed)
Pt has brought CAFA forms and supporting documents to [employer] office. We reviewed them and the application together. Will need SNAP letter with current benefits; I have reached out to Financial Counseling to see if they can assist with this paperwork. If not pt may have to go to DSS office to get specific letter, then we can submit CAFA form. Pt aware.   Otherwise is doing well, continues to get medications from [facility] without difficulty. 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, still receiving SNAP benefits. No additional questions/concerns at this time. Clarified my work cell from [employer].   Octavio Graves, MSW, LCSW [facility] Health Heart/Vascular Care Navigation  [phone]

## 2021-01-05 ENCOUNTER — Telehealth: Payer: Self-pay | Admitting: Licensed Clinical Social Worker

## 2021-01-05 NOTE — Telephone Encounter (Signed)
LCSW reached out to [facility] caseworker Doristine Devoid w/ [facility] DSS regarding [facility] letter (need letter with benefit amount). She was able to assist with sending me that documentation. Pt full CAFA was mailed on 7/18. Will f/u in 2 weeks to see if received. Processing of applications at this time is taking longer due to staffing.   Dustin Fowler, MSW, LCSW [facility] Health Heart/Vascular Care Navigation  [phone]

## 2021-01-11 ENCOUNTER — Telehealth: Payer: Self-pay | Admitting: Licensed Clinical Social Worker

## 2021-01-11 NOTE — Telephone Encounter (Signed)
Pt again called my desk phone this afternoon regarding changing/cancelling his appointment on Wednesday. I shared that I cannot reschedule patients and that the best way for pt to reschedule appointments is to go through the main number which was provided. I shared that pt has cancelled multiple previous appointments which may mean that the provider at a certain point may not be able to order medications etc for pt. Pt grew upset and states "I have to pay my rent and get food, you don't understand," I shared that I am aware that finances are tight for pt, given that we have applied for financial assistance programs but that there have been several Fridays where he was able to fit in a visit with me at the office, encouraged him to find a Friday that is open/try and fit in a visit at the office. Pt again was upset that there was not a way for me to reschedule his visit. Encouraged him again to call now to see if he could get through. I also gave Cleotilde Neer., RN w/ Dr. Jacques Navy a heads up regarding pt potentially cancelling his appointment again this week and pt current situation.   Dustin Fowler, MSW, LCSW [facility] Health Heart/Vascular Care Navigation  [phone]

## 2021-01-11 NOTE — Telephone Encounter (Signed)
LCSW reached out to pt this morning after noting missed call but no message on desk phone at [facility] office. LCSW sent text message sharing that I saw his clal and updating him that his application for [employer] Financial Assistance was mailed on Tuesday, 7/19. Pt states he may have mistakenly called me while trying to sort out his appointment this Wednesday. Provided him with main office number and encouraged him to reach out if any additional questions/concerns.   Dustin Fowler, MSW, LCSW [facility] Health Heart/Vascular Care Navigation  [phone]

## 2021-01-13 ENCOUNTER — Ambulatory Visit: Payer: Self-pay | Admitting: Internal Medicine

## 2021-01-28 ENCOUNTER — Telehealth: Payer: Self-pay | Admitting: Licensed Clinical Social Worker

## 2021-01-28 NOTE — Telephone Encounter (Signed)
LCSW received a call from pt regarding his CAFA. I shared that they are taking > 1 month to process, I will f/u with Mellody Dance in customer service to check and see if it has been received. Pt agreeable to me sending text message to let him know status of the application.   Message sent to [facility], pending response, no notes on account at this time.   Octavio Graves, MSW, LCSW [facility] Health Heart/Vascular Care Navigation  [phone]

## 2021-02-03 ENCOUNTER — Telehealth: Payer: Self-pay | Admitting: Licensed Clinical Social Worker

## 2021-02-03 NOTE — Telephone Encounter (Signed)
LCSW received a call from pt stating that he is out of [place] and that he called [facility] last week for the refill but it still hasn't arrived yet.   LCSW called [employer] and confirmed shipment was sent 01/28/2021 (tracking [phone]). In the meantime pt could benefit from samples to cover him until medications available. I will route this to pharmacy pool.   Dustin Fowler, MSW, LCSW [facility] Health Heart/Vascular Care Navigation  [phone]

## 2021-02-04 NOTE — Telephone Encounter (Signed)
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 I provided him with address and reminded him that he can utilize [facility] to get to that office for pick up [phone]). Appreciate PharmD and RN assistance with this.   Dustin Fowler, MSW, LCSW [facility] Health Heart/Vascular Care Navigation  [phone]

## 2021-02-04 NOTE — Telephone Encounter (Signed)
Pt responded to message regarding his samples being ready with the following text: "okay still at work my medicine probably come soon I'll wait have to work tomorrow getting off late." I responded as follows: "I encourage you if possible to get the medications just in case they continue not to arrive from pharmacy, just want to let you know they're ready for you!".   I will make pharmacy and Dr. Lupe Carney RN aware of that pt has run out and has not yet committed to picking up samples in the meantime.   Octavio Graves, MSW, LCSW [facility] Heart/Vascular Care Navigation  [phone]

## 2021-02-08 ENCOUNTER — Telehealth: Payer: Self-pay | Admitting: Licensed Clinical Social Worker

## 2021-02-08 NOTE — Telephone Encounter (Signed)
Upon chart review noted that pt financial assistance application is in need of last three pay stubs to complete his application. Pt previously had submitted a pay roll report as proof of income. Unfortunately, this isn't able to be used for this application. Pt contacted via text to let him know that- I also reached to [employer] who he is contracted through and Tammy shared with me that unfortunately she cannot reprint stubs, she is only able to print the report she had previously provided. She confirms pt is given pay stubs so he should have these in his possession. Remain available as needed.   Octavio Graves, MSW, LCSW [facility] Heart/Vascular Care Navigation  [phone]

## 2021-02-09 ENCOUNTER — Telehealth: Payer: Self-pay | Admitting: Licensed Clinical Social Worker

## 2021-02-09 NOTE — Telephone Encounter (Signed)
Received text back from pt stating that he picked up his medications and will work on getting the pay stubs. I provided him with billing dept number [phone]) if he has any additional questions. Pt states he works Mon-Fri and likely will "mail it to you," I encouraged pt if he is going to mail it to send directly to Business/Billing office as it is due by 02/24/21.  If pt does not get this completed by 9/7 then application will be denied and he will not be able to resubmit for another 6 months.   Pt has been mailed the information above as well by Business/Billing office and I remain available as needed to assist.  Octavio Graves, MSW, LCSW [facility] Heart/Vascular Care Navigation  [phone]

## 2021-02-12 ENCOUNTER — Ambulatory Visit: Payer: Self-pay | Admitting: Internal Medicine

## 2021-02-16 ENCOUNTER — Telehealth: Payer: Self-pay | Admitting: Licensed Clinical Social Worker

## 2021-02-16 NOTE — Telephone Encounter (Signed)
Text reminder sent to pt phone today.  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 Pt was aware and stated on 8/23 that he would get them. No updates since that time. Reminded him of deadline to submit of 02/24/21, if needed pt can bring them directly to [facility] office and I can submit to [place] who processes applications.   Remain available for additional questions/concerns.   Dustin Fowler, MSW, LCSW [facility] Heart/Vascular Care Navigation  [phone]

## 2021-02-17 ENCOUNTER — Telehealth: Payer: Self-pay | Admitting: Licensed Clinical Social Worker

## 2021-02-17 NOTE — Telephone Encounter (Signed)
Patient left voicemail and text message that he has mailed three pay stubs as requested on 8/30. I have let pt know I received his messages and provided him with financial department number [phone]. I also alerted customer service representative Mellody Dance who assists with processing CAFA applications. Documents are due by 9/7.   Dustin Fowler, MSW, LCSW [facility] Heart/Vascular Care Navigation  [phone]

## 2021-02-24 ENCOUNTER — Telehealth: Payer: Self-pay | Admitting: Licensed Clinical Social Worker

## 2021-02-24 NOTE — Telephone Encounter (Signed)
LCSW received a call from pt, no voicemail left. When I returned his call to [phone] was able to reach pt. He shared that he mailed his paperwork last week and was wondering if any update on CAFA. LCSW shared that I had contacted financial counseling and they had added a note to his account that paperwork on the way. No updates per account notes at this time. Will check again tomorrow afternoon before the weekend. Pt has been given billing office number multiple times should he also want to contact them directly.   Octavio Graves, MSW, LCSW [facility] Heart/Vascular Care Navigation  [phone]

## 2021-02-25 ENCOUNTER — Telehealth: Payer: Self-pay | Admitting: Licensed Clinical Social Worker

## 2021-02-25 NOTE — Telephone Encounter (Signed)
Noted the following account updates for pt:  PATIENT APPROVED FOR 75% FINANCIAL ASSISTANCE PER CAFA APPLICATION AND SUPPORTING DOCUMENTATION.  CAFA APP SCANNED TO ACCOUNT [id number] APPROVAL DATES OF [employer] ---- 01/04/21 - 07/07/21  APPROVAL LETTER ON ACCOUNT [id number]  Octavio Graves, MSW, LCSW [facility] Heart/Vascular Care Navigation  [phone]

## 2021-03-05 ENCOUNTER — Ambulatory Visit: Payer: Self-pay | Admitting: Physician Assistant

## 2021-04-02 ENCOUNTER — Ambulatory Visit: Payer: Self-pay | Admitting: Cardiology

## 2021-04-05 ENCOUNTER — Telehealth: Payer: Self-pay | Admitting: Licensed Clinical Social Worker

## 2021-04-05 NOTE — Telephone Encounter (Signed)
Pt has cancelled/no showed the past four PCP/Heartcare appointments. I have attempted to assist pt multiple times to ensure he can get to appointments. I have encouraged him to schedule later in the day and reminded him that he has access to [employer]. I had previously shared the possibility w/ pt that he would be limited in his ability to get refills etc if he continues to cancel/no show so he is aware. Remain available.   Octavio Graves, MSW, LCSW [facility] Heart/Vascular Care Navigation  [phone]

## 2021-04-13 ENCOUNTER — Other Ambulatory Visit: Payer: Self-pay

## 2021-04-13 ENCOUNTER — Ambulatory Visit: Payer: Self-pay | Attending: Internal Medicine | Admitting: Internal Medicine

## 2021-04-13 ENCOUNTER — Encounter: Payer: Self-pay | Admitting: Internal Medicine

## 2021-04-13 VITALS — BP 132/88 | HR 93 | Ht 70.0 in | Wt 230.0 lb

## 2021-04-13 DIAGNOSIS — I5042 Chronic combined systolic (congestive) and diastolic (congestive) heart failure: Secondary | ICD-10-CM

## 2021-04-13 DIAGNOSIS — I422 Other hypertrophic cardiomyopathy: Secondary | ICD-10-CM

## 2021-04-13 DIAGNOSIS — F1010 Alcohol abuse, uncomplicated: Secondary | ICD-10-CM

## 2021-04-13 DIAGNOSIS — I25118 Atherosclerotic heart disease of native coronary artery with other forms of angina pectoris: Secondary | ICD-10-CM

## 2021-04-13 DIAGNOSIS — E669 Obesity, unspecified: Secondary | ICD-10-CM

## 2021-04-13 DIAGNOSIS — Z23 Encounter for immunization: Secondary | ICD-10-CM

## 2021-04-13 DIAGNOSIS — F17200 Nicotine dependence, unspecified, uncomplicated: Secondary | ICD-10-CM

## 2021-04-13 DIAGNOSIS — I10 Essential (primary) hypertension: Secondary | ICD-10-CM

## 2021-04-13 DIAGNOSIS — Z1211 Encounter for screening for malignant neoplasm of colon: Secondary | ICD-10-CM

## 2021-04-13 LAB — POCT GLYCOSYLATED HEMOGLOBIN (HGB A1C): HbA1c, POC (prediabetic range): 5.6 % — AB (ref 5.7–6.4)

## 2021-04-13 MED ORDER — CARVEDILOL 12.5 MG PO TABS
12.5000 mg | ORAL_TABLET | Freq: Two times a day (BID) | ORAL | 6 refills | Status: DC
  Filled 2021-04-13: qty 60, 30d supply, fill #0

## 2021-04-13 MED ORDER — NICOTINE 14 MG/24HR TD PT24
14.0000 mg | MEDICATED_PATCH | Freq: Every day | TRANSDERMAL | 1 refills | Status: DC
  Filled 2021-04-13: qty 28, 28d supply, fill #0

## 2021-04-13 NOTE — Progress Notes (Signed)
Patient ID: NIHAR KLUS, male    DOB: [birth year]  MRN: [id number]  CC: Hypertension   Subjective: Dravon Nott is a 60 y.o. male who presents for  chronic ds management His concerns today include:  Patient with history of HTN, CHF systolic and diastolic EF 35-40%, CAD with combined ICM and NICM, mixed HL, tobacco dependence, preDM,  ETOH use disorder  HTN/CAD/Hypertrophic CM Currently taking: see medication list -hydralazine, Entresto, Farxiga, carvedilol, spironolactone, furosemide, isosorbide and atorvastatin.  Since last visit with me, he was diagnosed with hypertrophic cardiomyopathy.  He saw the cardiologist Dr. Lalla Brothers to be considered for ICD placement.  He was supposed to have a follow-up visit with Dr. Lalla Brothers last week but missed the appointment.  He plans to reschedule. Med Adherence: [x]  Yes    []  No Medication side effects: []  Yes    [x]  No Adherence with salt restriction: [x]  Yes    []  No Home Monitoring?: []  Yes    [x]  No Monitoring Frequency:  Home BP results range:  SOB? []  Yes    [x]  No Chest Pain?: []  Yes    [x]  No Leg swelling?: []  Yes    [x]  No Headaches?: []  Yes    [x]  No Dizziness? []  Yes    [x]  No.  Reports palpitations at times Comments: rides his bike to and from work 5 days/wk.  Takes 25 mins one way.   Still smoking but trying to quit.  "Its hard".  At 3-4 cigarettes a day. Drinks 16-40 oz beer a day.    PreDM/Obesity: Results for orders placed or performed in visit on 04/13/21  POCT glycosylated hemoglobin (Hb A1C)  Result Value Ref Range   Hemoglobin A1C     HbA1c POC (<> result, manual entry)     HbA1c, POC (prediabetic range) 5.6 (A) 5.7 - 6.4 %   HbA1c, POC (controlled diabetic range)    -wgh up 8 lbs since last visit in 08/2020. Doing good with eating habits.  Less fried foods, bake or broil most meats.  Getting in fruits and veggies. Drinks a lot of juices -Exercises daily by riding his bike to work.  HM: Agrees for flu shot and  Prevnar.  Agrees for colon cancer screening using a fit test since he does not have insurance for colonoscopy.    Patient Active Problem List   Diagnosis Date Noted   Influenza vaccine refused 08/21/2020   Essential hypertension 08/21/2020   Alcohol use disorder, moderate, dependence (HCC) 08/21/2020   Prediabetes 08/21/2020   Stage 3a chronic kidney disease (HCC) 08/21/2020   Cardiomyopathy (HCC) 08/04/2020   Coronary artery disease of native artery of native heart with stable angina pectoris (HCC) 08/04/2020   Renal insufficiency 08/04/2020   Chronic combined systolic and diastolic heart failure (HCC) 07/31/2020   Alcohol use disorder, mild, abuse 07/31/2020   Tobacco use disorder 07/31/2020   Hyperlipidemia 07/31/2020   Acute combined systolic and diastolic heart failure (HCC) 07/26/2020   Dyspnea on exertion 07/25/2020   Orthopnea 07/25/2020   Hypertensive urgency 07/25/2020     Current Outpatient Medications on File Prior to Visit  Medication Sig Dispense Refill   aspirin 81 MG EC tablet TAKE 1 TABLET (81 MG TOTAL) BY MOUTH DAILY. SWALLOW WHOLE. 30 tablet 11   atorvastatin (LIPITOR) 80 MG tablet Take 1 tablet (80 mg total) by mouth daily. 90 tablet 3   dapagliflozin propanediol (FARXIGA) 10 MG TABS tablet Take 1 tablet (10 mg total) by mouth daily. 30  tablet 6   furosemide (LASIX) 40 MG tablet Take 1 tablet (40 mg total) by mouth as needed. For shortness of breath, lower extremity edema or weight gain. 30 tablet 6   hydrALAZINE (APRESOLINE) 25 MG tablet TAKE 1 TABLET BY MOUTH 3 TIMES DAILY WITH A 50MG  TABLET. 90 tablet 6   hydrALAZINE (APRESOLINE) 50 MG tablet Take 1 tablet (50 mg total) by mouth 3 (three) times daily. 270 tablet 3   isosorbide mononitrate (IMDUR) 30 MG 24 hr tablet TAKE 1 TABLET (30 MG TOTAL) BY MOUTH DAILY. 30 tablet 6   nitroGLYCERIN (NITROSTAT) 0.4 MG SL tablet Place 1 tablet (0.4 mg total) under the tongue every 5 (five) minutes x 3 doses as needed for chest  pain. 25 tablet 6   sacubitril-valsartan (ENTRESTO) 97-103 MG TAKE 1 TABLET BY MOUTH TWO TIMES DAILY. 60 tablet 11   spironolactone (ALDACTONE) 25 MG tablet Take 1 tablet (25 mg total) by mouth daily. 90 tablet 3   No current facility-administered medications on file prior to visit.    No Known Allergies  Social History   Socioeconomic History   Marital status: Single    Spouse name: Not on file   Number of children: Not on file   Years of education: Not on file   Highest education level: Not on file  Occupational History   Not on file  Tobacco Use   Smoking status: Former    Packs/day: 1.50    Years: 40.00    Pack years: 60.00    Types: Cigarettes    Quit date: 07/24/2020    Years since quitting: 0.7   Smokeless tobacco: Never   Tobacco comments:    willing to stop.   Vaping Use   Vaping Use: Never used  Substance and Sexual Activity   Alcohol use: Yes    Alcohol/week: 4.0 standard drinks    Types: 4 Cans of beer per week    Comment: 1 40oz can a day, everyday.   Drug use: Not Currently   Sexual activity: Not on file  Other Topics Concern   Not on file  Social History Narrative   ** Merged History Encounter **       Social Determinants of Health   Financial Resource Strain: High Risk   Difficulty of Paying Living Expenses: Hard  Food Insecurity: Food Insecurity Present   Worried About 09/21/2020 in the Last Year: Sometimes true   Programme researcher, broadcasting/film/video in the Last Year: Sometimes true  Transportation Needs: Barista (Medical): Yes   Lack of Transportation (Non-Medical): Yes  Physical Activity: Insufficiently Active   Days of Exercise per Week: 4 days   Minutes of Exercise per Session: 30 min  Stress: Not on file  Social Connections: Not on file  Intimate Partner Violence: Not on file    No family history on file.  Past Surgical History:  Procedure Laterality Date   RIGHT/LEFT HEART CATH AND CORONARY  ANGIOGRAPHY N/A 07/27/2020   Procedure: RIGHT/LEFT HEART CATH AND CORONARY ANGIOGRAPHY;  Surgeon: 09/24/2020, MD;  Location: [facility] INV[facility];  Service: Cardiovascular;  Laterality: N/A;    ROS: Review of Systems Negative except as stated above  PHYSICAL EXAM: BP 132/88   Pulse 93   Ht 5\' 10"  (1.778 m)   Wt 230 lb (104.3 kg)   SpO2 97%   BMI 33.00 kg/m   Wt Readings from Last 3 Encounters:  04/13/21 230 lb (  104.3 kg)  12/11/20 235 lb 12.8 oz (107 kg)  08/21/20 222 lb 6.4 oz (100.9 kg)    Physical Exam General appearance - alert, well appearing, and in no distress Mental status - normal mood, behavior, speech, dress, motor activity, and thought processes Neck - supple, no significant adenopathy Chest - clear to auscultation, no wheezes, rales or rhonchi, symmetric air entry Heart - normal rate, regular rhythm, normal S1, S2, no murmurs, rubs, clicks or gallops Extremities - peripheral pulses normal, no pedal edema, no clubbing or cyanosis  CMP Latest Ref Rng & Units 08/21/2020 07/29/2020 07/27/2020  Glucose 65 - 99 mg/dL 92 99 -  BUN 6 - 24 mg/dL 60(Y) 15 -  Creatinine 0.76 - 1.27 mg/dL 3.01(S) 0.10(X) -  Sodium 134 - 144 mmol/L 142 140 141  Potassium 3.5 - 5.2 mmol/L 4.7 4.0 3.7  Chloride 96 - 106 mmol/L 105 106 -  CO2 20 - 29 mmol/L 20 24 -  Calcium 8.7 - 10.2 mg/dL 9.2 9.2 -  Total Protein 6.0 - 8.5 g/dL 7.3 - -  Total Bilirubin 0.0 - 1.2 mg/dL 0.3 - -  Alkaline Phos 44 - 121 IU/L 87 - -  AST 0 - 40 IU/L 27 - -  ALT 0 - 44 IU/L 28 - -   Lipid Panel     Component Value Date/Time   CHOL 260 (H) 07/25/2020 0809   TRIG 53 07/25/2020 0809   HDL 80 07/25/2020 0809   CHOLHDL 3.3 07/25/2020 0809   VLDL 11 07/25/2020 0809   LDLCALC 169 (H) 07/25/2020 0809    CBC    Component Value Date/Time   WBC 5.0 07/28/2020 0334   RBC 6.37 (H) 07/28/2020 0334   HGB 15.6 07/28/2020 0334   HCT 48.0 07/28/2020 0334   PLT 240 07/28/2020 0334   MCV 75.4 (L) 07/28/2020 0334    MCH 24.5 (L) 07/28/2020 0334   MCHC 32.5 07/28/2020 0334   RDW 16.5 (H) 07/28/2020 0334   LYMPHSABS 1.7 07/24/2020 1324   MONOABS 0.6 07/24/2020 1324   EOSABS 0.1 07/24/2020 1324   BASOSABS 0.0 07/24/2020 1324    ASSESSMENT AND PLAN: 1. Essential hypertension Not at goal.  Increase carvedilol to 12.5 mg twice a day. - carvedilol (COREG) 12.5 MG tablet; Take 1 tablet (12.5 mg total) by mouth 2 (two) times daily with a meal.  Dispense: 60 tablet; Refill: 6 - Comprehensive metabolic panel  2. Chronic combined systolic and diastolic heart failure (HCC) Patient to continue Clifton Custard, spironolactone  3. Coronary artery disease of native artery of native heart with stable angina pectoris (HCC) 4. Hypertrophic cardiomyopathy [facility]) Patient encouraged to call and reschedule his appointment with Dr. Lalla Brothers.  I told him that it is very important that he follows through with getting ICD placement given his diagnosis and heart function - carvedilol (COREG) 12.5 MG tablet; Take 1 tablet (12.5 mg total) by mouth 2 (two) times daily with a meal.  Dispense: 60 tablet; Refill: 6  5. Tobacco dependence Strongly advised to quit.  Patient would like to try the nicotine patches.  I went over with him how to use it. - nicotine (NICODERM CQ - DOSED IN MG/24 HOURS) 14 mg/24hr patch; Place 1 patch (14 mg total) onto the skin daily.  Dispense: 28 patch; Refill: 1  6. Obesity (BMI 30.0-34.9) Dietary counseling given.  Continue regular exercise. - POCT glycosylated hemoglobin (Hb A1C)  7. Alcohol use disorder, mild, abuse Encouraged him to try to cut back  even more.  He has set a goal to cut back to no more than a 16 ounce beer a day.  8. Need for vaccination against Streptococcus pneumoniae - Pneumococcal conjugate vaccine 20-valent  9. Screening for colon cancer - Fecal occult blood, imunochemical([facility]/Sunquest)  10. Need for immunization against influenza - Flu Vaccine QUAD 81mo+IM (Fluarix,  Fluzone & Alfiuria Quad PF)    Patient was given the opportunity to ask questions.  Patient verbalized understanding of the plan and was able to repeat key elements of the plan.   Orders Placed This Encounter  Procedures   Fecal occult blood, imunochemical([facility]/Sunquest)   Pneumococcal conjugate vaccine 20-valent   Flu Vaccine QUAD 15mo+IM (Fluarix, Fluzone & Alfiuria Quad PF)   Comprehensive metabolic panel   POCT glycosylated hemoglobin (Hb A1C)     Requested Prescriptions   Signed Prescriptions Disp Refills   nicotine (NICODERM CQ - DOSED IN MG/24 HOURS) 14 mg/24hr patch 28 patch 1    Sig: Place 1 patch (14 mg total) onto the skin daily.   carvedilol (COREG) 12.5 MG tablet 60 tablet 6    Sig: Take 1 tablet (12.5 mg total) by mouth 2 (two) times daily with a meal.    Return in about 4 months (around 08/14/2021).  Jonah Blue, MD, FACP

## 2021-04-13 NOTE — Patient Instructions (Signed)
Your blood pressure is not at goal.  We have increased the carvedilol to 12.5 mg twice a day.  I have sent a prescription to the pharmacy for the nicotine patches.

## 2021-04-14 ENCOUNTER — Other Ambulatory Visit: Payer: Self-pay

## 2021-04-14 LAB — COMPREHENSIVE METABOLIC PANEL
ALT: 26 IU/L (ref 0–44)
AST: 27 IU/L (ref 0–40)
Albumin/Globulin Ratio: 1.4 (ref 1.2–2.2)
Albumin: 4.4 g/dL (ref 3.8–4.9)
Alkaline Phosphatase: 83 IU/L (ref 44–121)
BUN/Creatinine Ratio: 12 (ref 9–20)
BUN: 18 mg/dL (ref 6–24)
Bilirubin Total: 0.5 mg/dL (ref 0.0–1.2)
CO2: 21 mmol/L (ref 20–29)
Calcium: 9.3 mg/dL (ref 8.7–10.2)
Chloride: 105 mmol/L (ref 96–106)
Creatinine, Ser: 1.49 mg/dL — ABNORMAL HIGH (ref 0.76–1.27)
Globulin, Total: 3.1 g/dL (ref 1.5–4.5)
Glucose: 92 mg/dL (ref 70–99)
Potassium: 4.3 mmol/L (ref 3.5–5.2)
Sodium: 140 mmol/L (ref 134–144)
Total Protein: 7.5 g/dL (ref 6.0–8.5)
eGFR: 54 mL/min/{1.73_m2} — ABNORMAL LOW (ref >59)

## 2021-04-14 NOTE — Progress Notes (Signed)
Let patient know that his kidney function is not 100% but has improved from when it was last checked in March.  He should avoid over-the-counter pain medications like ibuprofen, Aleve, Advil, Naprosyn and Motrin as these can make his kidney function worse.

## 2021-04-15 ENCOUNTER — Telehealth: Payer: Self-pay

## 2021-04-15 NOTE — Telephone Encounter (Signed)
Contacted pt to go over lab results pt didn't answer lvm   Sent a CRM and forward labs to NT to give pt labs when they call back   

## 2021-04-21 ENCOUNTER — Other Ambulatory Visit: Payer: Self-pay

## 2021-04-29 NOTE — Progress Notes (Deleted)
Cardiology Office Note:    Date:  04/29/2021   ID:  Dustin Fowler, DOB [birth year], MRN [id number]  PCP:  Marcine Matar, MD [facility] Cardiologist: Parke Poisson, MD   Reason for visit: 8 month follow-up  History of Present Illness:    Dustin Fowler is a 60 y.o. male with a hx of hypertension, nonischemic cardiopathy/HCOM, chronic systolic heart failure, CAD (distal RCA disease treated medically).    He saw Corine Shelter in February 2022 and was doing well/euvolemic.  He saw Dr. Steffanie Dunn with EP in June 2022 for evaluation of hypertrophic cardiomyopathy.  He recommended ICD given cardiac MRI findings and reduced EF.  Patient is in the process of applying for emergency Medicaid.  He is also referred to genetics team to rule out genetic causes for hypertrophic cardiopathy.  Today, ***  Combined systolic and diastolic heart failure (HCC) HCOM -Hospitalized 2/04-2/02/2021 -Severe hypertrophic cardiomyopathy with a maximal wall thickness of 28 mm.  Dr. Lalla Brothers recommended ICD given [facility] findings.  Pending insurance approval. -Echo March 2022 with EF 40 to 45%, severe concentric LVH, grade 1 diastolic dysfunction, normal RV function, moderately dilated LA -***  CAD (coronary artery disease) -LHC 07/2020: Severe single vessel coronary artery disease with chronic total/subtotal occlusion of the distal RCA/rPLA with bridging collaterals.  Mild to moderate, non-obstructive CAD noted in the left coronary artery. -Continue medical therapy. -***   Hypertension -Coreg increased at PCP visit April 13, 2021. -*** -Goal BP is <130/80.  Recommend DASH diet (high in vegetables, fruits, low-fat dairy products, whole grains, poultry, fish, and nuts and low in sweets, sugar-sweetened beverages, and red meats), salt restriction and increase physical activity.  Hyperlipidemia -*** -Discussed cholesterol lowering diets - Mediterranean diet, DASH diet, vegetarian  diet, low-carbohydrate diet and avoidance of trans fats.  Discussed healthier choice substitutes.  Nuts, high-fiber foods, and fiber supplements may also improve lipids.    Obesity -Discussed how even a 5-10% weight loss can have cardiovascular benefits.   -Recommend moderate intensity activity for 30 minutes 5 days/week and the DASH diet.  Tobacco use  -Prescribed nicotine patches by his PCP October 2022. -Recommend tobacco cessation.  Reviewed physiologic effects of nicotine and the immediate-eventual benefits of quitting including improvement in cough/breathing and reduction in cardiovascular events.  Discussed quitting tips such as removing triggers and getting support from family/friends and Quitline [place]. -USPSTF recommends one-time screening for abdominal aortic aneurysm (AAA) by ultrasound in men 78 -38 years old who have ever smoked.       Disposition - Follow-up in ***     No past medical history on file.  Past Surgical History:  Procedure Laterality Date   RIGHT/LEFT HEART CATH AND CORONARY ANGIOGRAPHY N/A 07/27/2020   Procedure: RIGHT/LEFT HEART CATH AND CORONARY ANGIOGRAPHY;  Surgeon: Yvonne Kendall, MD;  [facility];  Service: Cardiovascular;  Laterality: N/A;    Current Medications: No outpatient medications have been marked as taking for the 04/30/21 encounter (Appointment) with Cannon Kettle, PA-C.     Allergies:   Patient has no known allergies.   Social History   Socioeconomic History   Marital status: Single    Spouse name: Not on file   Number of children: Not on file   Years of education: Not on file   Highest education level: Not on file  Occupational History   Not on file  Tobacco Use   Smoking status: Former    Packs/day: 1.50  Years: 40.00    Pack years: 60.00    Types: Cigarettes    Quit date: 07/24/2020    Years since quitting: 0.7   Smokeless tobacco: Never   Tobacco comments:    willing to stop.   Vaping Use    Vaping Use: Never used  Substance and Sexual Activity   Alcohol use: Yes    Alcohol/week: 4.0 standard drinks    Types: 4 Cans of beer per week    Comment: 1 40oz can a day, everyday.   Drug use: Not Currently   Sexual activity: Not on file  Other Topics Concern   Not on file  Social History Narrative   ** Merged History Encounter **       Social Determinants of Health   Financial Resource Strain: High Risk   Difficulty of Paying Living Expenses: Hard  Food Insecurity: Food Insecurity Present   Worried About Charity fundraiser in the Last Year: Sometimes true   Arboriculturist in the Last Year: Sometimes true  Transportation Needs: Public librarian (Medical): Yes   Lack of Transportation (Non-Medical): Yes  Physical Activity: Insufficiently Active   Days of Exercise per Week: 4 days   Minutes of Exercise per Session: 30 min  Stress: Not on file  Social Connections: Not on file     Family History: The patient's family history is not on file.  ROS:   Please see the history of present illness.     EKGs/Labs/Other Studies Reviewed:    EKG:  The ekg ordered today demonstrates ***  Recent Labs: 07/24/2020: B Natriuretic Peptide 330.7 07/25/2020: TSH 1.607 07/28/2020: Hemoglobin 15.6; Platelets 240 04/13/2021: ALT 26; BUN 18; Creatinine, Ser 1.49; Potassium 4.3; Sodium 140   Recent Lipid Panel Lab Results  Component Value Date/Time   CHOL 260 (H) 07/25/2020 08:09 AM   TRIG 53 07/25/2020 08:09 AM   HDL 80 07/25/2020 08:09 AM   LDLCALC 169 (H) 07/25/2020 08:09 AM    Physical Exam:    VS:  There were no vitals taken for this visit.   No data found.  Wt Readings from Last 3 Encounters:  04/13/21 230 lb (104.3 kg)  12/11/20 235 lb 12.8 oz (107 kg)  08/21/20 222 lb 6.4 oz (100.9 kg)     GEN: *** Well nourished, well developed in no acute distress HEENT: Normal NECK: No JVD; No carotid bruits CARDIAC: ***RRR, no murmurs, rubs,  gallops RESPIRATORY:  Clear to auscultation without rales, wheezing or rhonchi  ABDOMEN: Soft, non-tender, non-distended MUSCULOSKELETAL: No edema; No deformity  SKIN: Warm and dry NEUROLOGIC:  Alert and oriented PSYCHIATRIC:  Normal affect     ASSESSMENT AND PLAN   ***   {Are you ordering a CV Procedure (e.g. stress test, cath, DCCV, TEE, etc)?   Press F2        :[id number]    Medication Adjustments/Labs and Tests Ordered: Current medicines are reviewed at length with the patient today.  Concerns regarding medicines are outlined above.  No orders of the defined types were placed in this encounter.  No orders of the defined types were placed in this encounter.   There are no Patient Instructions on file for this visit.   Signed, Warren Lacy, PA-C  04/29/2021 12:24 PM    [facility]

## 2021-04-30 ENCOUNTER — Ambulatory Visit: Payer: Self-pay | Admitting: Physician Assistant

## 2021-04-30 DIAGNOSIS — I2510 Atherosclerotic heart disease of native coronary artery without angina pectoris: Secondary | ICD-10-CM

## 2021-04-30 DIAGNOSIS — I5042 Chronic combined systolic (congestive) and diastolic (congestive) heart failure: Secondary | ICD-10-CM

## 2021-04-30 DIAGNOSIS — I422 Other hypertrophic cardiomyopathy: Secondary | ICD-10-CM

## 2021-04-30 DIAGNOSIS — I10 Essential (primary) hypertension: Secondary | ICD-10-CM

## 2021-04-30 DIAGNOSIS — F17200 Nicotine dependence, unspecified, uncomplicated: Secondary | ICD-10-CM

## 2021-05-24 ENCOUNTER — Telehealth: Payer: Self-pay

## 2021-05-24 MED ORDER — FUROSEMIDE 40 MG PO TABS: 40.0000 mg | ORAL_TABLET | ORAL | 3 refills | Status: DC | PRN

## 2021-05-24 NOTE — Telephone Encounter (Signed)
Faxed refill request received from [facility] for Furosemide 40 mg. Medication sent to pts chart as directed.

## 2021-06-28 ENCOUNTER — Telehealth: Payer: Self-pay | Admitting: Licensed Clinical Social Worker

## 2021-06-28 NOTE — Telephone Encounter (Signed)
LCSW aware pt current [facility] Financial Aid will expire on 07/07/2021. I have mailed him a new application along with instructions on how to contact [facility] financial counselor to complete. Since pt has no additional appointments on the schedule w/ Heartcare and has had multiple no shows he will need to complete w/ PCP office.   Dustin Fowler, MSW, LCSW Clinical Social Worker [facility] Navigation  [phone]- work cell phone (preferred) [phone]- desk phone

## 2021-08-17 ENCOUNTER — Telehealth: Payer: Self-pay | Admitting: Licensed Clinical Social Worker

## 2021-08-17 NOTE — Telephone Encounter (Signed)
LCSW has received a call from pt stating that his SNAP benefits had ended. LCSW shared that pt would need to contact the Department of Social Services regarding those benefits, he may need to recertify. I shared w/ pt that he also has had multiple no shows and cancellations with our offices (11/11, 10/14 and 9/16, 7/27 respectively). Last time pt was seen was 12/11/20.  I let pt know that he needs to reschedule his appointments with our office for further guidance if any additional questions/concerns arise. Pt has also not returned/worked on any new applications mailed to him at this time, states he has been "busy working and trying to make some money."   I will text him the numbers for DSS and our office to reschedule missed appointments.   Octavio Graves, MSW, LCSW Clinical Social Worker II [facility] Navigation  [phone]- work cell phone (preferred) [phone]- desk phone

## 2021-12-04 IMAGING — MR MR CARD MORPHOLOGY WO/W CM
45 of 48 series · 45 of 48 positions shown · IV contrast (gadavist)
Comparison: TTE 07/25/20, 08/27/20

EXAM:
CARDIAC MRI

CLINICAL DATA: Cardiomyopathy, follow up
TECHNIQUE: The patient was scanned on a 1.5 Tesla GE magnet. A dedicated
cardiac coil was used. Functional imaging was done using [employer]iest[employer]
sequences. [DATE], and 4 chamber views were done to assess for RWMA's.
Modified Lenhart rule using a short axis stack was used to
calculate an ejection fraction on a dedicated work station using
Circle software. The patient received 10mL GADAVIST GADOBUTROL 1
MMOL/ML IV SOLN. After 10 minutes inversion recovery sequences were
used to assess for infiltration and scar tissue.

[Series 4: [id number]_bh · axial · 8.0mm · 1.48mm/px · 1 of 16 slices shown]
[im 1/16]
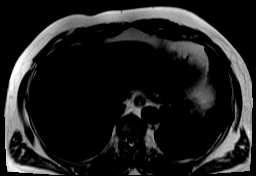

[Series 8: bSSFP · oblique · 8.0mm · 1.61mm/px · 1 of 25 slices shown (1 of 23)]
[im 1/25]
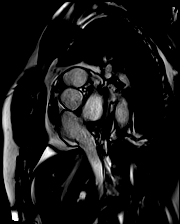

[Series 9: bSSFP · oblique · 8.0mm · 1.61mm/px · 1 of 25 slices shown (2 of 23)]
[im 1/25]
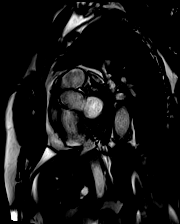

[Series 10: bSSFP · oblique · 8.0mm · 1.61mm/px · 1 of 25 slices shown (3 of 23)]
[im 1/25]
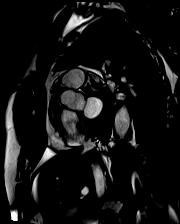

[Series 11: bSSFP · oblique · 8.0mm · 1.61mm/px · 1 of 25 slices shown (4 of 23)]
[im 1/25]
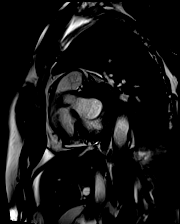

[Series 12: bSSFP · oblique · 8.0mm · 1.61mm/px · 1 of 25 slices shown (5 of 23)]
[im 1/25]
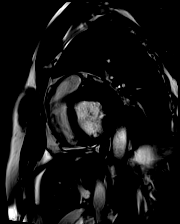

[Series 13: bSSFP · oblique · 8.0mm · 1.61mm/px · 1 of 25 slices shown (6 of 23)]
[im 1/25]
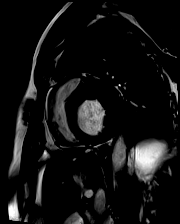

[Series 14: bSSFP · oblique · 8.0mm · 1.61mm/px · 1 of 25 slices shown (7 of 23)]
[im 1/25]
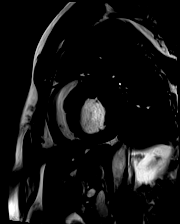

[Series 15: bSSFP · oblique · 8.0mm · 1.61mm/px · 1 of 25 slices shown (8 of 23)]
[im 1/25]
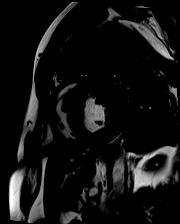

[Series 16: bSSFP · oblique · 8.0mm · 1.61mm/px · 1 of 25 slices shown (9 of 23)]
[im 1/25]
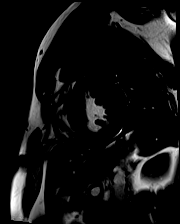

[Series 17: bSSFP · oblique · 8.0mm · 1.61mm/px · 1 of 25 slices shown (10 of 23)]
[im 1/25]
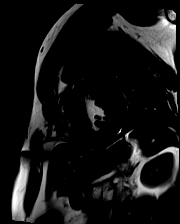

[Series 18: bSSFP · oblique · 8.0mm · 1.61mm/px · 1 of 25 slices shown (11 of 23)]
[im 1/25]
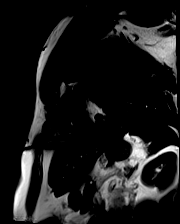

[Series 19: bSSFP · oblique · 8.0mm · 1.61mm/px · 1 of 25 slices shown (12 of 23)]
[im 1/25]
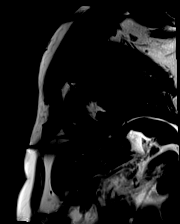

[Series 20: bSSFP · oblique · 8.0mm · 1.61mm/px · 1 of 25 slices shown (13 of 23)]
[im 1/25]
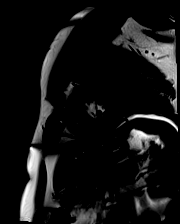

[Series 21: bSSFP · oblique · 8.0mm · 1.61mm/px · 1 of 25 slices shown (14 of 23)]
[im 1/25]
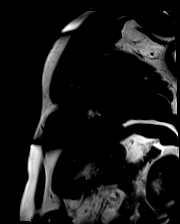

[Series 22: bSSFP · oblique · 8.0mm · 1.61mm/px · 1 of 25 slices shown (15 of 23)]
[im 1/25]
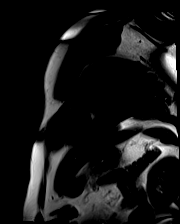

[Series 23: bSSFP · oblique · 8.0mm · 1.61mm/px · 1 of 25 slices shown (16 of 23)]
[im 1/25]
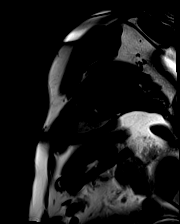

[Series 24: bSSFP · oblique · 8.0mm · 1.61mm/px · 1 of 25 slices shown (17 of 23)]
[im 1/25]
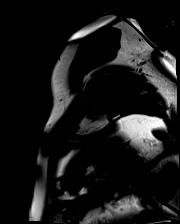

[Series 25: bSSFP · oblique · 8.0mm · 1.61mm/px · 1 of 25 slices shown (18 of 23)]
[im 1/25]
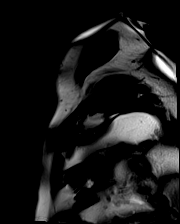

[Series 26: bSSFP · oblique · 8.0mm · 1.61mm/px · 1 of 25 slices shown (19 of 23)]
[im 1/25]
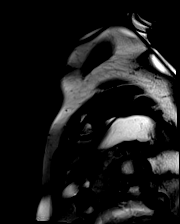

[Series 27: bSSFP · oblique · 6.0mm · 1.41mm/px · 1 of 25 slices shown (20 of 23)]
[im 1/25]
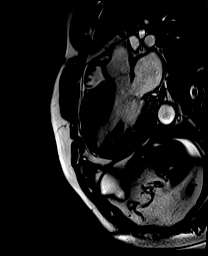

[Series 28: bSSFP · coronal · 6.0mm · 1.41mm/px · 1 of 25 slices shown (21 of 23)]
[im 1/25]
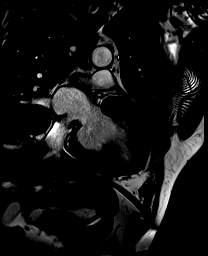

[Series 29: bSSFP · axial · 6.0mm · 1.41mm/px · 1 of 25 slices shown (22 of 23)]
[im 1/25]
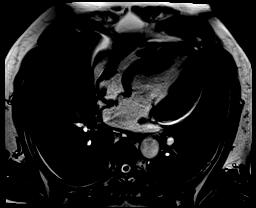

[Series 30: (id)_long_t1 · oblique · 8.0mm · 1.56mm/px · 1 of 24 slices shown]
[im 1/24]
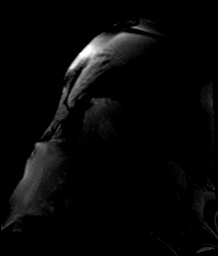

[Series 31: (id)_long_t1_moco · oblique · 8.0mm · 1.56mm/px · 1 of 24 slices shown]
[im 1/24]
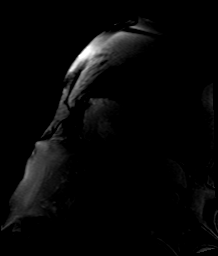

[Series 34: (id)_trufi · oblique · 8.0mm · 2.08mm/px · 1 of 9 slices shown]
[im 1/9]
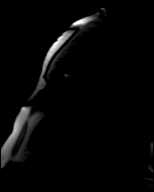

[Series 35: (id)_trufi_moco · oblique · 8.0mm · 2.08mm/px · 1 of 9 slices shown]
[im 1/9]
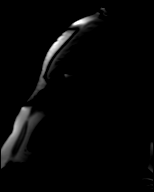

[Series 38: pre short axis · oblique · non-contrast · 8.0mm · 2.25mm/px · 1 of 10 slices shown (1 of 6)]
[im 1/10]
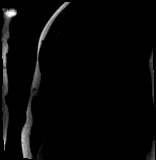

[Series 39: pre short axis · oblique · non-contrast · 8.0mm · 2.25mm/px · 1 of 10 slices shown (2 of 6)]
[im 1/10]
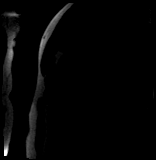

[Series 40: pre short axis · oblique · non-contrast · 8.0mm · 2.25mm/px · 1 of 10 slices shown (3 of 6)]
[im 1/10]
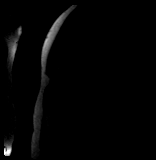

[Series 41: pre short axis · oblique · non-contrast · 8.0mm · 2.25mm/px · 1 of 10 slices shown (4 of 6)]
[im 1/10]
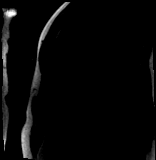

[Series 42: pre short axis · oblique · non-contrast · 8.0mm · 2.25mm/px · 1 of 10 slices shown (5 of 6)]
[im 1/10]
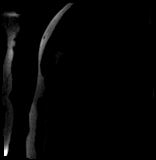

[Series 43: pre short axis · oblique · non-contrast · 8.0mm · 2.25mm/px · 1 of 10 slices shown (6 of 6)]
[im 1/10]
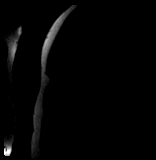

[Series 44: rest short axis · oblique · 8.0mm · 2.25mm/px · 1 of 60 slices shown (1 of 6)]
[im 1/60]
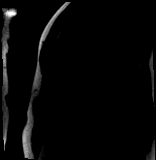

[Series 45: rest short axis · oblique · 8.0mm · 2.25mm/px · 1 of 60 slices shown (2 of 6)]
[im 1/60]
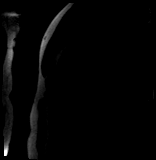

[Series 46: rest short axis · oblique · 8.0mm · 2.25mm/px · 1 of 60 slices shown (3 of 6)]
[im 1/60]
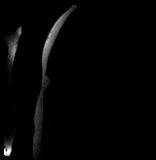

[Series 47: rest short axis · oblique · 8.0mm · 2.25mm/px · 1 of 60 slices shown (4 of 6)]
[im 1/60]
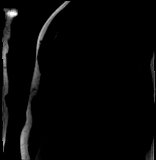

[Series 48: rest short axis · oblique · 8.0mm · 2.25mm/px · 1 of 60 slices shown (5 of 6)]
[im 1/60]
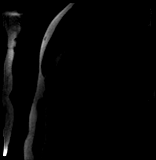

[Series 49: rest short axis · oblique · 8.0mm · 2.25mm/px · 1 of 60 slices shown (6 of 6)]
[im 1/60]
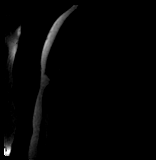

[Series 50: bSSFP · coronal · 6.0mm · 1.41mm/px · 1 of 25 slices shown (23 of 23)]
[im 1/25]
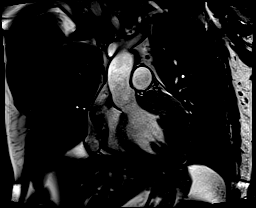

[Series 51: cine rvot · sagittal · 6.0mm · 1.41mm/px · 1 of 25 slices shown]
[im 1/25]
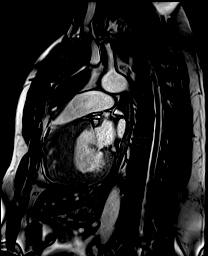

[Series 52: aortic valve cine · axial · 6.0mm · 1.41mm/px · 1 of 25 slices shown]
[im 1/25]
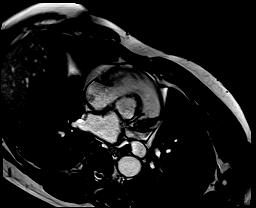

[Series 53: cine rvit · coronal · 6.0mm · 1.41mm/px · 1 of 25 slices shown]
[im 1/25]
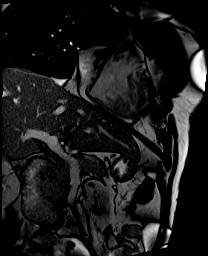

[Series 56: lge_single shot sa · oblique · 8.0mm · 2.08mm/px · 1 of 20 slices shown (1 of 2)]
[im 1/20]
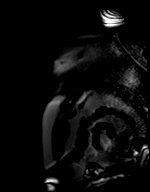

[Series 57: lge_single shot sa · oblique · 8.0mm · 2.08mm/px · 1 of 20 slices shown (2 of 2)]
[im 1/20]
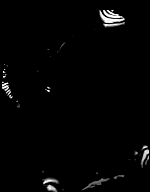

[45 of 48 positions shown; findings below may reference images not displayed]

FINDINGS: LEFT VENTRICLE:

Normal left ventricular chamber size.

Severely increased left ventricular wall thickness. Maximal wall
thickness 28 mm in the basal anterior and anterolateral and mid
lateral wall.

There is no systolic anterior motion of the mitral valve. No flow
dephasing - no left ventricular outflow tract obstruction. Mitral
regurgitation is not seen.

No evidence of left ventricular apical aneurysm.

Findings are consistent with hypertrophic cardiomyopathy without
obstruction. Morphologic subtype: Variant morphology. Findings most
suggestive of variant reverse curve morphology vs variant apical
hypertrophic cardiomyopathy.

No myocardial edema, T2 51 msec.

Grossly normal first pass perfusion.

Mild-moderately reduced left ventricular systolic function.

LVEF = 42%

There are regional wall motion abnormalities. Mild inferior wall
hypokinesis. Mild abnormal septal motion at the basal septum, likely
secondary to conduction delay.

There is post contrast delayed myocardial enhancement as noted:

Base - there is diffuse subendocardial and midmyocardial delayed
enhancement in the anteroseptum, anterior wall, and lateral wall.
There is also focal subendocardial delayed enhancement in the
inferoseptum and inferior wall.

Mid- diffuse subendocardial and midmyocardial delayed enhancement in
the septum, anterior wall, and lateral wall.

Apex - large, discreet foci of delayed enhancement in the mid-apical
anterior, lateral and inferior walls. At the distal most portion of
the apex, there is discreet transmural delayed enhancement in the
anterior wall.

Visually there is LGE in >15% of the myocardial mass. Challenging to
quantitate given mild motion artifact.

Normal T1 myocardial nulling kinetics suggest against a diagnosis of
cardiac amyloidosis. ECV = 27%, normal range.

RIGHT VENTRICLE:

Normal right ventricular chamber size.

Moderately increased right ventricular wall thickness.

Mildly reduced right ventricular systolic function.

RVEF = 43%

There are no regional wall motion abnormalities. Global hypokinesis.

No post contrast delayed myocardial enhancement.

ATRIA:

Moderate left atrial enlargement. Prominent Coumadin ridge (normal
structure).

Normal right atrial size.

VALVES:

No significant valvular abnormalities.

PERICARDIUM:

Normal pericardium.  No pericardial effusion.

OTHER: No significant extracardiac findings.

MEASUREMENTS:
Left ventricle:

LV male

LV EF: 42% (Normal 56-78%)

Absolute volumes:

LV EDV: 175 mL (Normal 77-195 mL)

LV ESV: 102 mL (Normal 19-72 mL)

LV SV: 73 mL (Normal 51-133 mL)

CO: 5.1 L/min (Normal 2.8-8.8 L/min)

Indexed volumes:

LV EDV: 79 mL/sq-m (Normal 47-92 mL/sq-m)

LV ESV: 46 mL/sq-m (Normal 13-30 mL/sq-m)

LV SV: 33 mL/sq-m (Normal 32-62 mL/sq-m)

CI: 2.3 L/min/sq-m (Normal 1.7-4.2 L/min/sq-m)

Right ventricle:

RV male

RV EF: 43 % (Normal 47-74%)

Absolute volumes:

RV EDV: 156  ML (Normal 88-227 mL)

RV ESV: 89mL (Normal 23-103 mL)

RV SV: 67  ML (normal 52-138 mL)

CO: 4.7  L/min (Normal 2.8-8.8 L/min)

Indexed volumes:

RV EDV: 70 mL/sq-m (Normal 55-105 mL/sq-m)

RV ESV: 40 mL/sq-m (Normal 15-43 mL/sq-m)

RV SV: 30 mL/sq-m (Normal 32-64 mL/sq-m)

CI: 2.12 L/min/sq-m (Normal 1.7-4.2 L/min/sq-m)
IMPRESSION: 1. Hypertrophic cardiomyopathy, variant morphology with diffuse
hypertrophy. See findings. Maximal wall thickness 28 mm in the basal
anterior and anterolateral and mid lateral wall.

2. No LV apical pouch. No LVOT obstruction. No systolic anterior
motion of the mitral valve.

3. Mild-moderately reduced LV systolic function with normal LV
chamber size, LVEF 42%. Mild inferior wall hypokinesis. Mild
abnormal septal motion at the basal septum, likely secondary to
conduction delay.

4. Diffuse post contrast delayed myocardial enhancement from base to
apex, most prominent in areas of myocardial hypertrophy, visually
>15% of myocardial mass. Focal delayed enhancement at the apex in
the anterior wall.

5. Mildly reduced right ventricular systolic function, normal right
ventricular chamber size. RVEF 43%.

## 2022-06-09 ENCOUNTER — Telehealth: Payer: Self-pay | Admitting: Internal Medicine

## 2022-06-09 ENCOUNTER — Telehealth: Payer: Self-pay | Admitting: Licensed Clinical Social Worker

## 2022-06-09 NOTE — Telephone Encounter (Signed)
H&V Care Navigation CSW Progress Note  Clinical Social Worker  contacted by pt [phone])  to request assistance with medications. No scripts are currently valid as pt last seen by PCP on 03/2021 and [facility] on 11/2020. Pt needs to make a new appt to ensure medications appropriate. Pt provided with that number to schedule an appt. Once he has updated care plan we can further assist with programs for bills and medications.     Patient is participating in a Managed Medicaid Plan:  No, self pay.   SDOH Screenings   Food Insecurity: Food Insecurity Present (07/27/2020)  Housing: Low Risk  (07/27/2020)  Transportation Needs: Unmet Transportation Needs (07/27/2020)  Alcohol Screen: Low Risk  (07/27/2020)  Depression (PHQ2-9): Low Risk  (08/21/2020)  Financial Resource Strain: High Risk (07/27/2020)  Physical Activity: Insufficiently Active (07/27/2020)  Tobacco Use: Medium Risk (04/13/2021)   Octavio Graves, MSW, LCSW Clinical Social Worker II [facility] Care Navigation  [phone]- work cell phone (preferred) [phone]- desk phone

## 2022-06-09 NOTE — Telephone Encounter (Signed)
*  STAT* If patient is at the pharmacy, call can be transferred to refill team.   1. Which medications need to be refilled? (please list name of each medication and dose if known)   Entresto 97mg    atorvastatin (LIPITOR) 80 MG tablet Take 1 tablet (80 mg total) by mouth daily.   carvedilol (COREG) 12.5 MG tablet Take 1 tablet (12.5 mg total) by mouth 2 (two) times daily with a meal.   dapagliflozin propanediol (FARXIGA) 10 MG TABS tablet Take 1 tablet (10 mg total) by mouth daily.   furosemide (LASIX) 40 MG tablet (Expired) Take 1 tablet (40 mg total) by mouth as needed. For shortness of breath, lower extremity edema or weight gain.   hydrALAZINE (APRESOLINE) 25 MG tablet (Expired) TAKE 1 TABLET BY MOUTH 3 TIMES DAILY WITH A 50MG  TABLET.   hydrALAZINE (APRESOLINE) 50 MG tablet Take 1 tablet (50 mg total) by mouth 3 (three) times daily.   isosorbide mononitrate (IMDUR) 30 MG 24 hr tablet (Expired) TAKE 1 TABLET (30 MG TOTAL) BY MOUTH DAILY.   nicotine (NICODERM CQ - DOSED IN MG/24 HOURS) 14 mg/24hr patch Place 1 patch (14 mg total) onto the skin daily.   nitroGLYCERIN (NITROSTAT) 0.4 MG SL tablet Place 1 tablet (0.4 mg total) under the tongue every 5 (five) minutes x 3 doses as needed for chest pain.   spironolactone (ALDACTONE) 25 MG tablet Take 1 tablet (25 mg total) by mouth daily.     2. Which pharmacy/location (including street and city if local pharmacy) is medication to be sent to?  [facility] - [place], [place] - [address], [address]    3. Do they need a 30 day or 90 day supply? 90  Pt is requesting med refill on all these medications. Pt made an appt with Dr. (seen in the hospital last year 2022) for 07/07/22.

## 2022-06-10 NOTE — Telephone Encounter (Signed)
Per chart these medications have not been prescribed in >1 year. Patient has not been seen in office since 12/11/20 (Dr. Lalla Brothers) and has no-showed multiple appts since that date.   Please review request. Thanks!

## 2022-07-07 ENCOUNTER — Ambulatory Visit: Payer: Self-pay | Attending: Internal Medicine | Admitting: Internal Medicine

## 2022-07-08 ENCOUNTER — Telehealth: Payer: Self-pay | Admitting: Licensed Clinical Social Worker

## 2022-07-08 NOTE — Telephone Encounter (Signed)
H&V Care Navigation CSW Progress Note  Pt no showed appt yesterday at [facility] office- no rescheduled appt at this time. No scripts are currently valid as pt last seen by PCP on 03/2021 and [facility] on 11/2020. Pt has no showed or cancelled appts on 04/30/21, 04/02/21 and 03/05/21, 01/13/21 respectively. Last time pt was seen was 12/11/20 at a heart/vascular office- will need to re-engage with care team and have appt for [place] to provide ongoing care navigation support/updated assistance applications.   Patient is participating in a Managed Medicaid Plan:  No,self pay only.   SDOH Screenings   Food Insecurity: Food Insecurity Present (07/27/2020)  Housing: Low Risk  (07/27/2020)  Transportation Needs: Unmet Transportation Needs (07/27/2020)  Alcohol Screen: Low Risk  (07/27/2020)  Depression (PHQ2-9): Low Risk  (08/21/2020)  Financial Resource Strain: High Risk (07/27/2020)  Physical Activity: Insufficiently Active (07/27/2020)  Tobacco Use: Medium Risk (04/13/2021)    Westley Hummer, MSW, LCSW Clinical Social Worker [place]  [phone]- work cell phone (preferred) [phone]- desk phone

## 2022-09-13 ENCOUNTER — Other Ambulatory Visit: Payer: Self-pay

## 2022-09-13 ENCOUNTER — Emergency Department (HOSPITAL_COMMUNITY): Payer: 59

## 2022-09-13 ENCOUNTER — Emergency Department (HOSPITAL_COMMUNITY)
Admission: EM | Admit: 2022-09-13 | Discharge: 2022-09-13 | Disposition: A | Discharge status: Home / Self Care | Payer: 59 | Attending: Student | Admitting: Student

## 2022-09-13 ENCOUNTER — Encounter (HOSPITAL_COMMUNITY): Payer: Self-pay

## 2022-09-13 DIAGNOSIS — R0989 Other specified symptoms and signs involving the circulatory and respiratory systems: Secondary | ICD-10-CM

## 2022-09-13 DIAGNOSIS — I110 Hypertensive heart disease with heart failure: Secondary | ICD-10-CM | POA: Insufficient documentation

## 2022-09-13 DIAGNOSIS — Z5329 Procedure and treatment not carried out because of patient's decision for other reasons: Secondary | ICD-10-CM | POA: Diagnosis not present

## 2022-09-13 DIAGNOSIS — R7989 Other specified abnormal findings of blood chemistry: Secondary | ICD-10-CM | POA: Insufficient documentation

## 2022-09-13 DIAGNOSIS — I509 Heart failure, unspecified: Secondary | ICD-10-CM | POA: Insufficient documentation

## 2022-09-13 DIAGNOSIS — R0789 Other chest pain: Secondary | ICD-10-CM | POA: Diagnosis not present

## 2022-09-13 LAB — BASIC METABOLIC PANEL
Anion gap: 11 (ref 5–15)
BUN: 26 mg/dL — ABNORMAL HIGH (ref 6–20)
CO2: 22 mmol/L (ref 22–32)
Calcium: 9.1 mg/dL (ref 8.9–10.3)
Chloride: 108 mmol/L (ref 98–111)
Creatinine, Ser: 1.61 mg/dL — ABNORMAL HIGH (ref 0.61–1.24)
GFR, Estimated: 49 mL/min — ABNORMAL LOW (ref >60)
Glucose, Bld: 92 mg/dL (ref 70–99)
Potassium: 4 mmol/L (ref 3.5–5.1)
Sodium: 141 mmol/L (ref 135–145)

## 2022-09-13 LAB — CBC
HCT: 49.7 % (ref 39.0–52.0)
Hemoglobin: 15.1 g/dL (ref 13.0–17.0)
MCH: 23.5 pg — ABNORMAL LOW (ref 26.0–34.0)
MCHC: 30.4 g/dL (ref 30.0–36.0)
MCV: 77.4 fL — ABNORMAL LOW (ref 80.0–100.0)
Platelets: 212 10*3/uL (ref 150–400)
RBC: 6.42 MIL/uL — ABNORMAL HIGH (ref 4.22–5.81)
RDW: 17 % — ABNORMAL HIGH (ref 11.5–15.5)
WBC: 7 10*3/uL (ref 4.0–10.5)
nRBC: 0 % (ref 0.0–0.2)

## 2022-09-13 LAB — TROPONIN I (HIGH SENSITIVITY)
Troponin I (High Sensitivity): 180 ng/L (ref <18)
Troponin I (High Sensitivity): 189 ng/L (ref <18)

## 2022-09-13 MED ORDER — ASPIRIN 81 MG PO CHEW
324.0000 mg | CHEWABLE_TABLET | Freq: Once | ORAL | Status: AC
  Administered 2022-09-13: 324 mg via ORAL
  Filled 2022-09-13: qty 4

## 2022-09-13 MED ORDER — HYDRALAZINE HCL 20 MG/ML IJ SOLN
10.0000 mg | Freq: Once | INTRAMUSCULAR | Status: AC
  Administered 2022-09-13: 10 mg via INTRAVENOUS
  Filled 2022-09-13: qty 1

## 2022-09-13 NOTE — ED Notes (Signed)
Pt state=ing he wants to leave [facility]. PA notified.

## 2022-09-13 NOTE — ED Notes (Signed)
MD notified of Troponin level of 180.

## 2022-09-13 NOTE — ED Triage Notes (Signed)
Pt came to ED for chest congestion that started last year. Pt has hx of HTN and stopped taking medicine bc it gave pt HA. axox4. BP 160's

## 2022-09-13 NOTE — ED Provider Notes (Signed)
[facility] Provider Note   CSN: [id number] Arrival date & time: 09/13/22  1045     History  Chief Complaint  Patient presents with   Chest conjestion     Dustin Fowler is a 62 y.o. male.  The history is provided by the patient and medical records. No language interpreter was used.     62 year old male with significant history hypertension not compliant with his medication, CHF, polysubstance use, CAD, prediabetes, presenting with complaint of chest discomfort.  Patient report for nearly a year he has had recurrent chest congestion.  He also endorsed cough sometimes productive with sputum.  He attributed to being outside in the cold on a regular basis.  He states he has been meaning to come to the hospital to be evaluated but due to his work schedule he does not have much time.  However today he is free and decided to come in for evaluation.  He denies worsening symptoms.  He also admits that he has not seen his doctor in a year because he does not have time in his schedule to come to the doctor.  He also discontinue his blood pressure medication because it gives him headaches.  He does not endorse any runny nose sneezing or itchy eyes.  He does admits to alcohol and tobacco use but denies any recreational drug use.  No recent sick contact.  Home Medications Prior to Admission medications   Medication Sig Start Date End Date Taking? Authorizing Provider  atorvastatin (LIPITOR) 80 MG tablet Take 1 tablet (80 mg total) by mouth daily. 09/11/20   Elouise Munroe, MD  carvedilol (COREG) 12.5 MG tablet Take 1 tablet (12.5 mg total) by mouth 2 (two) times daily with a meal. 04/13/21   Ladell Pier, MD  dapagliflozin propanediol (FARXIGA) 10 MG TABS tablet Take 1 tablet (10 mg total) by mouth daily. 07/30/20   Bhagat, Crista Luria, PA  furosemide (LASIX) 40 MG tablet Take 1 tablet (40 mg total) by mouth as needed. For shortness of breath,  lower extremity edema or weight gain. 05/24/21 05/24/22  Elouise Munroe, MD  hydrALAZINE (APRESOLINE) 25 MG tablet TAKE 1 TABLET BY MOUTH 3 TIMES DAILY WITH A 50MG  TABLET. 08/21/20 08/21/21  Ladell Pier, MD  hydrALAZINE (APRESOLINE) 50 MG tablet Take 1 tablet (50 mg total) by mouth 3 (three) times daily. 09/11/20   Elouise Munroe, MD  isosorbide mononitrate (IMDUR) 30 MG 24 hr tablet TAKE 1 TABLET (30 MG TOTAL) BY MOUTH DAILY. 07/29/20 07/29/21  Bhagat, Crista Luria, PA  nicotine (NICODERM CQ - DOSED IN MG/24 HOURS) 14 mg/24hr patch Place 1 patch (14 mg total) onto the skin daily. 04/13/21   Ladell Pier, MD  nitroGLYCERIN (NITROSTAT) 0.4 MG SL tablet Place 1 tablet (0.4 mg total) under the tongue every 5 (five) minutes x 3 doses as needed for chest pain. 09/11/20   Elouise Munroe, MD  spironolactone (ALDACTONE) 25 MG tablet Take 1 tablet (25 mg total) by mouth daily. 09/11/20   Elouise Munroe, MD      Allergies    Patient has no known allergies.    Review of Systems   Review of Systems  All other systems reviewed and are negative.   Physical Exam Updated Vital Signs BP (!) 194/114 (BP Location: Left Arm)   Pulse (!) 114   Temp 99 F (37.2 C) (Oral)   Resp 18   Ht 5\' 10"  (1.778 m)  Wt 102.1 kg   SpO2 98%   BMI 32.28 kg/m  Physical Exam Vitals and nursing note reviewed.  Constitutional:      General: He is not in acute distress.    Appearance: He is well-developed.  HENT:     Head: Atraumatic.  Eyes:     Conjunctiva/sclera: Conjunctivae normal.  Cardiovascular:     Rate and Rhythm: Normal rate and regular rhythm.     Pulses: Normal pulses.     Heart sounds: Normal heart sounds.  Pulmonary:     Effort: Pulmonary effort is normal.     Breath sounds: Normal breath sounds. No wheezing, rhonchi or rales.  Chest:     Chest wall: No tenderness.  Abdominal:     Palpations: Abdomen is soft.     Tenderness: There is no abdominal tenderness.  Musculoskeletal:      Cervical back: Neck supple.  Skin:    Capillary Refill: Capillary refill takes less than 2 seconds.     Findings: No rash.  Neurological:     Mental Status: He is alert.     ED Results / Procedures / Treatments   Labs (all labs ordered are listed, but only abnormal results are displayed) Labs Reviewed  BASIC METABOLIC PANEL - Abnormal; Notable for the following components:      Result Value   BUN 26 (*)    Creatinine, Ser 1.61 (*)    GFR, Estimated 49 (*)    All other components within normal limits  CBC - Abnormal; Notable for the following components:   RBC 6.42 (*)    MCV 77.4 (*)    MCH 23.5 (*)    RDW 17.0 (*)    All other components within normal limits  TROPONIN I (HIGH SENSITIVITY) - Abnormal; Notable for the following components:   Troponin I (High Sensitivity) 180 (*)    All other components within normal limits  BRAIN NATRIURETIC PEPTIDE  TROPONIN I (HIGH SENSITIVITY)    EKG EKG Interpretation  Date/Time:  Tuesday September 13 2022 11:53:25 EDT Ventricular Rate:  95 PR Interval:  174 QRS Duration: 132 QT Interval:  378 QTC Calculation: 475 R Axis:   129 Text Interpretation: Sinus rhythm with Premature atrial complexes Right axis deviation Non-specific intra-ventricular conduction block Minimal voltage criteria for LVH, may be normal variant ( Cornell product ) Abnormal ECG When compared with ECG of 24-Jul-2020 13:07, PREVIOUS ECG IS PRESENT Confirmed by Kommor, Madison ([id number]) on 09/13/2022 1:06:44 PM  Radiology DG Chest 1 View  Result Date: 09/13/2022 CLINICAL DATA:  Chest pain EXAM: CHEST  1 VIEW COMPARISON:  CXR 07/24/20 FINDINGS: No pleural effusion. No pneumothorax. Cardiomegaly. No focal airspace opacity. Prominent bilateral interstitial opacities, which can be seen in the setting of pulmonary venous congestion without overt edema. No radiographically apparent displaced rib fractures. Visualized upper abdomen is unremarkable. IMPRESSION: Cardiomegaly with  pulmonary venous congestion. No overt edema. Electronically Signed   By: Marin Roberts M.D.   On: 09/13/2022 12:41    Procedures .Critical Care  Performed by: Domenic Moras, PA-C Authorized by: Domenic Moras, PA-C   Critical care provider statement:    Critical care time (minutes):  30   Critical care was time spent personally by me on the following activities:  Development of treatment plan with patient or surrogate, discussions with consultants, evaluation of patient's response to treatment, examination of patient, ordering and review of laboratory studies, ordering and review of radiographic studies, ordering and performing treatments and interventions, pulse oximetry,  re-evaluation of patient's condition and review of old charts     Medications Ordered in ED Medications  hydrALAZINE (APRESOLINE) injection 10 mg (10 mg Intravenous Given 09/13/22 1330)  aspirin chewable tablet 324 mg (324 mg Oral Given 09/13/22 1329)    ED Course/ Medical Decision Making/ A&P                             Medical Decision Making Amount and/or Complexity of Data Reviewed Labs: ordered. Radiology: ordered.  Risk OTC drugs. Prescription drug management. Decision regarding hospitalization.   BP (!) 194/114 (BP Location: Left Arm)   Pulse (!) 114   Temp 99 F (37.2 C) (Oral)   Resp 18   Ht 5\' 10"  (1.778 m)   Wt 102.1 kg   SpO2 98%   BMI 32.28 kg/m   7:59 AM 62 year old male with significant history hypertension not compliant with his medication, CHF, polysubstance use, CAD, prediabetes, presenting with complaint of chest discomfort.  Patient report for nearly a year he has had recurrent chest congestion.  He also endorsed cough sometimes productive with sputum.  He attributed to being outside in the cold on a regular basis.  He states he has been meaning to come to the hospital to be evaluated but due to his work schedule he does not have much time.  However today he is free and decided to come in  for evaluation.  He denies worsening symptoms.  He also admits that he has not seen his doctor in a year because he does not have time in his schedule to come to the doctor.  He also discontinue his blood pressure medication because it gives him headaches.  He does not endorse any runny nose sneezing or itchy eyes.  He does admits to alcohol and tobacco use but denies any recreational drug use.  No recent sick contact.  On exam this is a well-appearing male resting comfortably in the bed appears to be in no acute discomfort.  He does not have any evidence of JVD.  Heart with normal rate and rhythm, lungs are clear to auscultation bilaterally abdomen soft nontender he has intact distal pulses throughout.  Vitals are notable for elevated blood pressure of 194/114.  He was also noted to have a elevated heart rate of 114.  His oral temperature is 99.  No hypoxia.  -Labs ordered, independently viewed and interpreted by me.  Labs remarkable for elevated troponin of 180, pt has had elevated trop in 2022 and was admitted at that time for cardiac work up.  Will give ASA and consult cardiology.  Cr 1.61, impaired but similar to prior.  -The patient was maintained on a cardiac monitor.  I personally viewed and interpreted the cardiac monitored which showed an underlying rhythm of: NSR -Imaging independently viewed and interpreted by me and I agree with radiologist's interpretation.  Result remarkable for CXR showing cardiomegaly with pulmonary venous congestion, no pna -This patient presents to the ED for concern of chest congestion, this involves an extensive number of treatment options, and is a complaint that carries with it a high risk of complications and morbidity.  The differential diagnosis includes CHF, Viral illness, PNA, ACS, PE, pleurisy, allergy -Co morbidities that complicate the patient evaluation includes CHF, HTN, polysubstance use, hypertrophic cardiomyopathy -Treatment includes ASA -Reevaluation  of the patient after these medicines showed that the patient stayed the same -PCP office notes or outside notes reviewed -Discussion with  cardiology team who will evaluate pt in the ER -Escalation to admission/observation considered:   2:26 PM Patient informed that he would like to to leave and does not want to stay for further evaluation.  I stressed the importance of further evaluation by cardiology due to his elevated troponin and his discomfort.  Patient still wants to leave stating that he needs to be able to get back to work to pay his bill.  He was understanding that leaving [place] can potentially lead to worse outcomes such as cardiac death patient acknowledges risks and understands that he can return for further evaluation.        Final Clinical Impression(s) / ED Diagnoses Final diagnoses:  Elevated troponin  Chest congestion    Rx / DC Orders ED Discharge Orders     None         Domenic Moras, PA-C 09/13/22 [place], MD 09/13/22 1900

## 2023-03-03 ENCOUNTER — Encounter (HOSPITAL_COMMUNITY): Payer: Self-pay

## 2023-03-03 ENCOUNTER — Other Ambulatory Visit: Payer: Self-pay

## 2023-03-03 ENCOUNTER — Observation Stay (HOSPITAL_BASED_OUTPATIENT_CLINIC_OR_DEPARTMENT_OTHER): Payer: 59

## 2023-03-03 ENCOUNTER — Observation Stay (HOSPITAL_COMMUNITY): Payer: 59

## 2023-03-03 ENCOUNTER — Observation Stay (HOSPITAL_COMMUNITY)
Admission: EM | Admit: 2023-03-03 | Discharge: 2023-03-04 | Disposition: A | Discharge status: Home / Self Care | Payer: 59 | Attending: Internal Medicine | Admitting: Internal Medicine

## 2023-03-03 ENCOUNTER — Emergency Department (HOSPITAL_COMMUNITY): Payer: 59

## 2023-03-03 DIAGNOSIS — R0609 Other forms of dyspnea: Secondary | ICD-10-CM

## 2023-03-03 DIAGNOSIS — Z1152 Encounter for screening for COVID-19: Secondary | ICD-10-CM | POA: Insufficient documentation

## 2023-03-03 DIAGNOSIS — I2510 Atherosclerotic heart disease of native coronary artery without angina pectoris: Secondary | ICD-10-CM | POA: Diagnosis not present

## 2023-03-03 DIAGNOSIS — I5043 Acute on chronic combined systolic (congestive) and diastolic (congestive) heart failure: Secondary | ICD-10-CM | POA: Diagnosis not present

## 2023-03-03 DIAGNOSIS — R0602 Shortness of breath: Secondary | ICD-10-CM

## 2023-03-03 DIAGNOSIS — R3589 Other polyuria: Secondary | ICD-10-CM | POA: Diagnosis not present

## 2023-03-03 DIAGNOSIS — I421 Obstructive hypertrophic cardiomyopathy: Secondary | ICD-10-CM | POA: Diagnosis not present

## 2023-03-03 DIAGNOSIS — R7989 Other specified abnormal findings of blood chemistry: Secondary | ICD-10-CM | POA: Insufficient documentation

## 2023-03-03 DIAGNOSIS — R718 Other abnormality of red blood cells: Secondary | ICD-10-CM | POA: Insufficient documentation

## 2023-03-03 DIAGNOSIS — F17200 Nicotine dependence, unspecified, uncomplicated: Secondary | ICD-10-CM

## 2023-03-03 DIAGNOSIS — R7309 Other abnormal glucose: Secondary | ICD-10-CM | POA: Insufficient documentation

## 2023-03-03 DIAGNOSIS — I214 Non-ST elevation (NSTEMI) myocardial infarction: Principal | ICD-10-CM

## 2023-03-03 DIAGNOSIS — F17210 Nicotine dependence, cigarettes, uncomplicated: Secondary | ICD-10-CM | POA: Insufficient documentation

## 2023-03-03 DIAGNOSIS — N1831 Chronic kidney disease, stage 3a: Secondary | ICD-10-CM | POA: Insufficient documentation

## 2023-03-03 DIAGNOSIS — I130 Hypertensive heart and chronic kidney disease with heart failure and stage 1 through stage 4 chronic kidney disease, or unspecified chronic kidney disease: Secondary | ICD-10-CM | POA: Insufficient documentation

## 2023-03-03 DIAGNOSIS — I25118 Atherosclerotic heart disease of native coronary artery with other forms of angina pectoris: Secondary | ICD-10-CM

## 2023-03-03 DIAGNOSIS — I509 Heart failure, unspecified: Secondary | ICD-10-CM

## 2023-03-03 DIAGNOSIS — I10 Essential (primary) hypertension: Secondary | ICD-10-CM

## 2023-03-03 DIAGNOSIS — J441 Chronic obstructive pulmonary disease with (acute) exacerbation: Secondary | ICD-10-CM

## 2023-03-03 DIAGNOSIS — I5020 Unspecified systolic (congestive) heart failure: Secondary | ICD-10-CM

## 2023-03-03 HISTORY — DX: Prediabetes: R73.03

## 2023-03-03 HISTORY — DX: Heart failure, unspecified: I50.9

## 2023-03-03 HISTORY — DX: Atherosclerotic heart disease of native coronary artery without angina pectoris: I25.10

## 2023-03-03 HISTORY — DX: Essential (primary) hypertension: I10

## 2023-03-03 LAB — COMPREHENSIVE METABOLIC PANEL
ALT: 28 U/L (ref 0–44)
AST: 30 U/L (ref 15–41)
Albumin: 3.3 g/dL — ABNORMAL LOW (ref 3.5–5.0)
Alkaline Phosphatase: 61 U/L (ref 38–126)
Anion gap: 12 (ref 5–15)
BUN: 20 mg/dL (ref 6–20)
CO2: 23 mmol/L (ref 22–32)
Calcium: 8.7 mg/dL — ABNORMAL LOW (ref 8.9–10.3)
Chloride: 105 mmol/L (ref 98–111)
Creatinine, Ser: 1.67 mg/dL — ABNORMAL HIGH (ref 0.61–1.24)
GFR, Estimated: 47 mL/min — ABNORMAL LOW (ref >60)
Glucose, Bld: 111 mg/dL — ABNORMAL HIGH (ref 70–99)
Potassium: 4.5 mmol/L (ref 3.5–5.1)
Sodium: 140 mmol/L (ref 135–145)
Total Bilirubin: 0.8 mg/dL (ref 0.3–1.2)
Total Protein: 6.9 g/dL (ref 6.5–8.1)

## 2023-03-03 LAB — CBC WITH DIFFERENTIAL/PLATELET
Abs Immature Granulocytes: 0.02 10*3/uL (ref 0.00–0.07)
Basophils Absolute: 0 10*3/uL (ref 0.0–0.1)
Basophils Relative: 0 %
Eosinophils Absolute: 0 10*3/uL (ref 0.0–0.5)
Eosinophils Relative: 1 %
HCT: 46.2 % (ref 39.0–52.0)
Hemoglobin: 14.2 g/dL (ref 13.0–17.0)
Immature Granulocytes: 0 %
Lymphocytes Relative: 20 %
Lymphs Abs: 1.2 10*3/uL (ref 0.7–4.0)
MCH: 23.5 pg — ABNORMAL LOW (ref 26.0–34.0)
MCHC: 30.7 g/dL (ref 30.0–36.0)
MCV: 76.4 fL — ABNORMAL LOW (ref 80.0–100.0)
Monocytes Absolute: 0.6 10*3/uL (ref 0.1–1.0)
Monocytes Relative: 10 %
Neutro Abs: 4.2 10*3/uL (ref 1.7–7.7)
Neutrophils Relative %: 69 %
Platelets: 217 10*3/uL (ref 150–400)
RBC: 6.05 MIL/uL — ABNORMAL HIGH (ref 4.22–5.81)
RDW: 17.3 % — ABNORMAL HIGH (ref 11.5–15.5)
WBC: 6.1 10*3/uL (ref 4.0–10.5)
nRBC: 0 % (ref 0.0–0.2)

## 2023-03-03 LAB — SARS CORONAVIRUS 2 BY RT PCR: SARS Coronavirus 2 by RT PCR: NEGATIVE

## 2023-03-03 LAB — TROPONIN I (HIGH SENSITIVITY)
Troponin I (High Sensitivity): 152 ng/L (ref <18)
Troponin I (High Sensitivity): 176 ng/L (ref <18)

## 2023-03-03 LAB — BRAIN NATRIURETIC PEPTIDE: B Natriuretic Peptide: 217.8 pg/mL — ABNORMAL HIGH (ref 0.0–100.0)

## 2023-03-03 MED ORDER — ADULT MULTIVITAMIN W/MINERALS CH
1.0000 | ORAL_TABLET | Freq: Every day | ORAL | Status: DC
  Administered 2023-03-03 – 2023-03-04 (×2): 1 via ORAL
  Filled 2023-03-03 (×2): qty 1

## 2023-03-03 MED ORDER — FOLIC ACID 1 MG PO TABS
1.0000 mg | ORAL_TABLET | Freq: Every day | ORAL | Status: DC
  Administered 2023-03-03 – 2023-03-04 (×2): 1 mg via ORAL
  Filled 2023-03-03 (×2): qty 1

## 2023-03-03 MED ORDER — THIAMINE HCL 100 MG/ML IJ SOLN
100.0000 mg | Freq: Every day | INTRAMUSCULAR | Status: DC
  Filled 2023-03-03: qty 2

## 2023-03-03 MED ORDER — IPRATROPIUM-ALBUTEROL 0.5-2.5 (3) MG/3ML IN SOLN
3.0000 mL | Freq: Once | RESPIRATORY_TRACT | Status: AC
  Administered 2023-03-03: 3 mL via RESPIRATORY_TRACT
  Filled 2023-03-03: qty 3

## 2023-03-03 MED ORDER — FUROSEMIDE 10 MG/ML IJ SOLN
40.0000 mg | Freq: Once | INTRAMUSCULAR | Status: AC
  Administered 2023-03-03: 40 mg via INTRAVENOUS
  Filled 2023-03-03: qty 4

## 2023-03-03 MED ORDER — THIAMINE MONONITRATE 100 MG PO TABS
100.0000 mg | ORAL_TABLET | Freq: Every day | ORAL | Status: DC
  Administered 2023-03-03 – 2023-03-04 (×2): 100 mg via ORAL
  Filled 2023-03-03 (×2): qty 1

## 2023-03-03 MED ORDER — HEPARIN SODIUM (PORCINE) 5000 UNIT/ML IJ SOLN
5000.0000 [IU] | Freq: Three times a day (TID) | INTRAMUSCULAR | Status: DC
  Administered 2023-03-03: 5000 [IU] via SUBCUTANEOUS
  Filled 2023-03-03: qty 1

## 2023-03-03 MED ORDER — CARVEDILOL 12.5 MG PO TABS
12.5000 mg | ORAL_TABLET | Freq: Two times a day (BID) | ORAL | Status: DC
  Administered 2023-03-03 – 2023-03-04 (×2): 12.5 mg via ORAL
  Filled 2023-03-03 (×2): qty 1

## 2023-03-03 MED ORDER — NICOTINE 14 MG/24HR TD PT24
14.0000 mg | MEDICATED_PATCH | Freq: Every day | TRANSDERMAL | Status: DC
  Administered 2023-03-04: 14 mg via TRANSDERMAL
  Filled 2023-03-03 (×2): qty 1

## 2023-03-03 MED ORDER — IPRATROPIUM-ALBUTEROL 0.5-2.5 (3) MG/3ML IN SOLN
3.0000 mL | Freq: Four times a day (QID) | RESPIRATORY_TRACT | Status: DC | PRN
  Administered 2023-03-04: 3 mL via RESPIRATORY_TRACT
  Filled 2023-03-03: qty 3

## 2023-03-03 MED ORDER — IPRATROPIUM-ALBUTEROL 0.5-2.5 (3) MG/3ML IN SOLN
6.0000 mL | Freq: Once | RESPIRATORY_TRACT | Status: AC
  Administered 2023-03-03: 6 mL via RESPIRATORY_TRACT
  Filled 2023-03-03: qty 6

## 2023-03-03 MED ORDER — RIVAROXABAN 10 MG PO TABS
10.0000 mg | ORAL_TABLET | Freq: Every day | ORAL | Status: DC
  Administered 2023-03-03: 10 mg via ORAL
  Filled 2023-03-03: qty 1

## 2023-03-03 MED ORDER — LORAZEPAM 1 MG PO TABS: 1.0000 mg | ORAL_TABLET | ORAL | Status: DC | PRN

## 2023-03-03 MED ORDER — HYDRALAZINE HCL 50 MG PO TABS
50.0000 mg | ORAL_TABLET | Freq: Three times a day (TID) | ORAL | Status: DC
  Administered 2023-03-03: 50 mg via ORAL
  Filled 2023-03-03: qty 2

## 2023-03-03 MED ORDER — ATORVASTATIN CALCIUM 80 MG PO TABS
80.0000 mg | ORAL_TABLET | Freq: Every day | ORAL | Status: DC
  Administered 2023-03-03 – 2023-03-04 (×2): 80 mg via ORAL
  Filled 2023-03-03 (×2): qty 1

## 2023-03-03 MED ORDER — ASPIRIN 325 MG PO TBEC
325.0000 mg | DELAYED_RELEASE_TABLET | Freq: Every day | ORAL | Status: DC
  Administered 2023-03-03 – 2023-03-04 (×2): 325 mg via ORAL
  Filled 2023-03-03 (×2): qty 1

## 2023-03-03 MED ORDER — FUROSEMIDE 10 MG/ML IJ SOLN: 40.0000 mg | Freq: Every day | INTRAMUSCULAR | Status: DC

## 2023-03-03 MED ORDER — SPIRONOLACTONE 25 MG PO TABS
25.0000 mg | ORAL_TABLET | Freq: Every day | ORAL | Status: DC
  Administered 2023-03-03 – 2023-03-04 (×2): 25 mg via ORAL
  Filled 2023-03-03: qty 1
  Filled 2023-03-03: qty 2

## 2023-03-03 NOTE — Progress Notes (Signed)
Echocardiogram 2D Echocardiogram has been performed.  Dustin Fowler 03/03/2023, 4:50 PM

## 2023-03-03 NOTE — Hospital Course (Addendum)
No meds in the past year Orthop, sob, cough,  Coopd duoneb Heart failure Cards will see   Cvouple months dysobne cough sputum DOE- baseline 100 feet?? Or now 1 pillow usually, now 2 Chronic cough, worse in am with congestion  Usually white but now green  Beer can make it worse Peeing ok, maybe more  Hx of murmur or irreg heart rhythm Hfmref Copd   24 oz beers 1/2 ppd, 40+ No drug  Htn in fam, no cancer, no sudden death  Didn't want icd

## 2023-03-03 NOTE — Consult Note (Addendum)
Cardiology Consultation   Patient ID: Dustin Fowler MRN: [id number]; DOB: 1961-04-25  Admit date: 03/03/2023 Date of Consult: 03/03/2023  PCP:  Marcine Matar, MD   [facility] Providers Cardiologist:  Parke Poisson, MD  Electrophysiologist:  Lanier Prude, MD  {  Patient Profile:   Dustin Fowler is a 62 y.o. male with a hx of hypertrophic cardiomyopathy, HTN, preDM, ETOH use who is being seen 03/03/2023 for the evaluation of CHF/elevated troponins at the request of Dustin Fowler.  History of Present Illness:   Dustin Fowler is a 62 year old male with past medical history noted above.  He was initially seen in the ED 07/2020 with hypertensive urgency.  He was found to have a mildly elevated troponin in the setting of volume overload.  During the course of workup was found to have combined systolic and diastolic CHF with LVEF of 35 to 40%, grade 3 diastolic dysfunction, akinetic apex, severely dilated left atrium, mildly reduced RV.  Underwent cardiac catheterization 07/27/2020 showing severe single-vessel CAD with chronic total/subtotal occlusion of distal RCA/R PLA with bridging collaterals.  Mild to moderate nonobstructive CAD in the LAD.  Recommendations for continued medical therapy.  It was felt that his presentation of cardiomyopathy was likely nonischemic as his degree of LV dysfunction was out of proportion to his coronary artery disease.  He was started on GDMT with Coreg 6.25 mg twice daily, Farxiga 10 mg daily, Entresto 97/103 mg twice daily, hydralazine 50 mg 3 times daily and Imdur 30 mg daily, in addition to aspirin and atorvastatin 80 mg daily.  Seen back in the clinic for follow-up with Dustin Shelter, PA and reported doing well at that visit.  Stated he was on his medications as directed.  He was continued on his medications without further adjustments.   Underwent cardiac MRI showing hypertrophic cardiomyopathy, variant morphology with diffuse  hypertrophy.  Mild to moderately reduced LV systolic function with normal LV chamber, LVEF of 42%, mild inferior wall hypokinesis.  He was referred to EP and seen by Dr. Lalla Brothers 11/2020 who felt there was a clear indication for ICD given cardiac MRI findings with reduced EF.  At that time he was planning to apply for Medicaid and once that was approved he was to be scheduled for defibrillator.  Patient presented to the ED on 9/13 with complaints of increasing shortness of breath, orthopnea and PND.  He has been working in [employer] most recently and is now insured.  States he has been out of all of his cardiac medications for at least a year.  States he quit taking them because he was convinced they were contributing to his headaches.  He denies any chest pain, or significant lower extremity edema  In the ED his labs showed sodium 140, potassium 4.5, creatinine 1.6, BNP 217, high-sensitivity troponin 152, WBC 6.1, hemoglobin 14.2.  Chest x-ray with cardiac enlargement and vascular congestion.  EKG showed sinus tachycardia, 116 bpm, LVH.  He has been admitted to internal medicine.  Cardiology asked to evaluate.  Past Medical History:  Diagnosis Date   CAD (coronary artery disease)    CHF (congestive heart failure) (HCC)    Hypertension    Prediabetes     Past Surgical History:  Procedure Laterality Date   RIGHT/LEFT HEART CATH AND CORONARY ANGIOGRAPHY N/A 07/27/2020   Procedure: RIGHT/LEFT HEART CATH AND CORONARY ANGIOGRAPHY;  Surgeon: Yvonne Kendall, MD;  Location: [facility] INVASIVE CV LAB;  Service: Cardiovascular;  Laterality: N/A;  Home Medications:  Prior to Admission medications   Medication Sig Start Date End Date Taking? Authorizing Provider  atorvastatin (LIPITOR) 80 MG tablet Take 1 tablet (80 mg total) by mouth daily. Patient not taking: Reported on 03/03/2023 09/11/20   Parke Poisson, MD  carvedilol (COREG) 12.5 MG tablet Take 1 tablet (12.5 mg total) by mouth 2 (two) times  daily with a meal. Patient not taking: Reported on 03/03/2023 04/13/21   Marcine Matar, MD  dapagliflozin propanediol (FARXIGA) 10 MG TABS tablet Take 1 tablet (10 mg total) by mouth daily. Patient not taking: Reported on 03/03/2023 07/30/20   Manson Passey, PA  furosemide (LASIX) 40 MG tablet Take 1 tablet (40 mg total) by mouth as needed. For shortness of breath, lower extremity edema or weight gain. Patient not taking: Reported on 03/03/2023 05/24/21 05/24/22  Parke Poisson, MD  hydrALAZINE (APRESOLINE) 25 MG tablet TAKE 1 TABLET BY MOUTH 3 TIMES DAILY WITH A 50MG  TABLET. Patient not taking: Reported on 03/03/2023 08/21/20 08/21/21  Marcine Matar, MD  hydrALAZINE (APRESOLINE) 50 MG tablet Take 1 tablet (50 mg total) by mouth 3 (three) times daily. Patient not taking: Reported on 03/03/2023 09/11/20   Parke Poisson, MD  isosorbide mononitrate (IMDUR) 30 MG 24 hr tablet TAKE 1 TABLET (30 MG TOTAL) BY MOUTH DAILY. Patient not taking: Reported on 03/03/2023 07/29/20 07/29/21  Manson Passey, PA  nicotine (NICODERM CQ - DOSED IN MG/24 HOURS) 14 mg/24hr patch Place 1 patch (14 mg total) onto the skin daily. Patient not taking: Reported on 03/03/2023 04/13/21   Marcine Matar, MD  nitroGLYCERIN (NITROSTAT) 0.4 MG SL tablet Place 1 tablet (0.4 mg total) under the tongue every 5 (five) minutes x 3 doses as needed for chest pain. Patient not taking: Reported on 03/03/2023 09/11/20   Parke Poisson, MD  spironolactone (ALDACTONE) 25 MG tablet Take 1 tablet (25 mg total) by mouth daily. Patient not taking: Reported on 03/03/2023 09/11/20   Parke Poisson, MD    Inpatient Medications: Scheduled Meds:  aspirin EC  325 mg Oral Daily   atorvastatin  80 mg Oral Daily   carvedilol  12.5 mg Oral BID WC   folic acid  1 mg Oral Daily   [START ON 03/04/2023] furosemide  40 mg Intravenous Daily   heparin  5,000 Units Subcutaneous Q8H   multivitamin with minerals  1 tablet Oral Daily    nicotine  14 mg Transdermal Daily   spironolactone  25 mg Oral Daily   thiamine  100 mg Oral Daily   Or   thiamine  100 mg Intravenous Daily   Continuous Infusions:  PRN Meds: LORazepam  Allergies:   No Known Allergies  Social History:   Social History   Socioeconomic History   Marital status: Single    Spouse name: Not on file   Number of children: Not on file   Years of education: Not on file   Highest education level: Not on file  Occupational History   Not on file  Tobacco Use   Smoking status: Every Day    Current packs/day: 0.00    Types: Cigarettes    Start date: 07/24/1980    Last attempt to quit: 07/24/2020    Years since quitting: 2.6   Smokeless tobacco: Never   Tobacco comments:    willing to stop.   Vaping Use   Vaping status: Never Used  Substance and Sexual Activity   Alcohol use: Yes  Alcohol/week: 4.0 standard drinks of alcohol    Types: 4 Cans of beer per week    Comment: 1 40oz can a day, everyday.   Drug use: Not Currently   Sexual activity: Not on file  Other Topics Concern   Not on file  Social History Narrative   ** Merged History Encounter **       Social Determinants of Health   Financial Resource Strain: High Risk (07/27/2020)   Overall Financial Resource Strain (CARDIA)    Difficulty of Paying Living Expenses: Hard  Food Insecurity: Food Insecurity Present (07/27/2020)   Hunger Vital Sign    Worried About Running Out of Food in the Last Year: Sometimes true    Ran Out of Food in the Last Year: Sometimes true  Transportation Needs: Unmet Transportation Needs (07/27/2020)   PRAPARE - Administrator, Civil Service (Medical): Yes    Lack of Transportation (Non-Medical): Yes  Physical Activity: Insufficiently Active (07/27/2020)   Exercise Vital Sign    Days of Exercise per Week: 4 days    Minutes of Exercise per Session: 30 min  Stress: Not on file  Social Connections: Not on file  Intimate Partner Violence: Not on file     Family History:   History reviewed. No pertinent family history.   ROS:  Please see the history of present illness.   All other ROS reviewed and negative.     Physical Exam/Data:   Vitals:   03/03/23 1004 03/03/23 1006 03/03/23 1036 03/03/23 1300  BP:  (!) 166/77 (!) 168/88 (!) 147/50  Pulse:  (!) 106 (!) 109   Resp: (!) 22 (!) 21 (!) 24 (!) 35  Temp:  98.4 F (36.9 C)    TempSrc:  Oral    SpO2:  100% 100%   Weight:      Height:        Intake/Output Summary (Last 24 hours) at 03/03/2023 1441 Last data filed at 03/03/2023 1132 Gross per 24 hour  Intake --  Output 1550 ml  Net -1550 ml      03/03/2023    8:38 AM 09/13/2022   11:09 AM 04/13/2021    3:57 PM  Last 3 Weights  Weight (lbs) 225 lb 225 lb 230 lb  Weight (kg) 102.059 kg 102.059 kg 104.327 kg     Body mass index is 32.28 kg/m.  General:  Well nourished, well developed, in no acute distress HEENT: normal Neck: no JVD Vascular: No carotid bruits; Distal pulses 2+ bilaterally Cardiac:  normal S1, S2; RRR; no murmur  Lungs:  clear to auscultation bilaterally, no wheezing, rhonchi or rales  Abd: soft, nontender, no hepatomegaly  Ext: no edema Musculoskeletal:  No deformities, BUE and BLE strength normal and equal Skin: warm and dry  Neuro:  CNs 2-12 intact, no focal abnormalities noted Psych:  Normal affect   EKG:  The EKG was personally reviewed and demonstrates: Sinus tachycardia, 116 bpm, LVH  Relevant CV Studies:  Cath: 07/2020  Conclusions: Severe single vessel coronary artery disease with chronic total/subtotal occlusion of the distal RCA/rPLA with bridging collaterals.  Mild to moderate, non-obstructive CAD noted in the left coronary artery. Mildly elevated left and right heart filling pressures. Normal Fick cardiac output/index.   Recommendations: Continue medical therapy of acute systolic heart failure.  Dustin Fowler this representing mostly non-ischemic cardiomyopathy as degree of LV dysfunction is  out of proportion to coronary artery disease. Aggressive secondary prevention of CAD.   Yvonne Kendall, MD [facility]  Echo: 08/2020  IMPRESSIONS     1. Left ventricular ejection fraction, by estimation, is 40 to 45%. The  left ventricle has mildly decreased function. The left ventricle  demonstrates regional wall motion abnormalities (see scoring  diagram/findings for description). There is severe  concentric left ventricular hypertrophy. Left ventricular diastolic  parameters are consistent with Grade I diastolic dysfunction (impaired  relaxation). Elevated left atrial pressure.   2. Right ventricular systolic function is normal. The right ventricular  size is normal.   3. Left atrial size was moderately dilated.   4. The mitral valve is normal in structure. Trivial mitral valve  regurgitation. No evidence of mitral stenosis.   5. The aortic valve is normal in structure. Aortic valve regurgitation is  not visualized. No aortic stenosis is present.   6. The inferior vena cava is normal in size with greater than 50%  respiratory variability, suggesting right atrial pressure of 3 mmHg.   FINDINGS   Left Ventricle: Left ventricular ejection fraction, by estimation, is 40  to 45%. The left ventricle has mildly decreased function. The left  ventricle demonstrates regional wall motion abnormalities. The left  ventricular internal cavity size was normal  in size. There is severe concentric left ventricular hypertrophy. Abnormal  (paradoxical) septal motion, consistent with left bundle branch block.  Left ventricular diastolic parameters are consistent with Grade I  diastolic dysfunction (impaired  relaxation). Elevated left atrial pressure.     LV Wall Scoring:  The inferior wall, mid inferoseptal segment, and basal inferoseptal  segment  are hypokinetic. The apical septal segment and apical inferior segment are  normal.   Right Ventricle: The right ventricular size is  normal. No increase in  right ventricular wall thickness. Right ventricular systolic function is  normal.   Left Atrium: Left atrial size was moderately dilated.   Right Atrium: Right atrial size was normal in size.   Pericardium: There is no evidence of pericardial effusion.   Mitral Valve: The mitral valve is normal in structure. Trivial mitral  valve regurgitation. No evidence of mitral valve stenosis.   Tricuspid Valve: The tricuspid valve is normal in structure. Tricuspid  valve regurgitation is trivial. No evidence of tricuspid stenosis.   Aortic Valve: The aortic valve is normal in structure. Aortic valve  regurgitation is not visualized. No aortic stenosis is present.   Pulmonic Valve: The pulmonic valve was normal in structure. Pulmonic valve  regurgitation is not visualized. No evidence of pulmonic stenosis.   Aorta: The aortic root is normal in size and structure.   Venous: The inferior vena cava is normal in size with greater than 50%  respiratory variability, suggesting right atrial pressure of 3 mmHg.   IAS/Shunts: No atrial level shunt detected by color flow Doppler.   Laboratory Data:  High Sensitivity Troponin:   Recent Labs  Lab 03/03/23 0849 03/03/23 1043  TROPONINIHS 152* 176*     Chemistry Recent Labs  Lab 03/03/23 0849  NA 140  K 4.5  CL 105  CO2 23  GLUCOSE 111*  BUN 20  CREATININE 1.67*  CALCIUM 8.7*  GFRNONAA 47*  ANIONGAP 12    Recent Labs  Lab 03/03/23 0849  PROT 6.9  ALBUMIN 3.3*  AST 30  ALT 28  ALKPHOS 61  BILITOT 0.8   Lipids No results for input(s): "CHOL", "TRIG", "HDL", "LABVLDL", "LDLCALC", "CHOLHDL" in the last 168 hours.  Hematology Recent Labs  Lab 03/03/23 0849  WBC 6.1  RBC 6.05*  HGB  14.2  HCT 46.2  MCV 76.4*  MCH 23.5*  MCHC 30.7  RDW 17.3*  PLT 217   Thyroid No results for input(s): "TSH", "FREET4" in the last 168 hours.  BNP Recent Labs  Lab 03/03/23 0849  BNP 217.8*    DDimer No results  for input(s): "DDIMER" in the last 168 hours.   Radiology/Studies:  DG Chest Portable 1 View  Result Date: 03/03/2023 CLINICAL DATA:  Dyspnea EXAM: PORTABLE CHEST 1 VIEW COMPARISON:  09/13/2022 FINDINGS: Enlarged cardiopericardial silhouette. The inferior costophrenic angles are clipped off the edge of the film. No pneumothorax or effusion. There are some interstitial changes with some mild central vascular congestion. Overlapping cardiac leads. Degenerative changes of the spine. IMPRESSION: Enlarged cardiopericardial silhouette with some vascular congestion and some interstitial changes. Electronically Signed   By: Karen Kays M.D.   On: 03/03/2023 10:17     Assessment and Plan:   Dustin Fowler is a 62 y.o. male with a hx of hypertrophic cardiomyopathy, HTN, preDM who is being seen 03/03/2023 for the evaluation of CHF/elevated troponins at the request of Dustin Fowler.  HFrEF Hypertrophic cardiomyopathy -- Known history with LVEF 40 to 45%.  He was previously on GDMT including carvedilol, Entresto, spironolactone, Farxiga, Lasix, Imdur and hydralazine.  He reports being off all medications for at least 1 year. -- BNP 217, chest x-ray with vascular congestion -- s/p 40 mg IV Lasix x 1 in the ED, some urine output reported by patient -- Will need to resume GDMT: Ordered for carvedilol 12.5 mg twice daily, spironolactone 25 mg daily.  Would plan to resume [facility] tomorrow pending renal function. Given his hx of hypertrophic cardiomyopathy would avoid resumption of hydralazine and Imdur -- start IV lasix 40mg  daily -- check echo -- Reports he now has insurance, we discussed previous plan for ICD (he is agreeable) will need outpatient EP follow up  Elevated troponin -- High-sensitivity troponin 152>> 176 -- In the setting of poorly controlled hypertension and known CAD (occluded distal RCA), likely demand ischemia -- Denies any chest pain -- Continue aspirin, statin, blocker  Poorly  controlled hypertension -- As noted above has been out of medications for over a year -- Resumed on Coreg 12.5 mg twice daily, spironolactone 25 mg daily. As above, would plan to resume Entresto tomorrow (suspect will need lower dose than previously given)  ETOH/Tobacco use -- still drinking 2 beers/day and smoking -- cessation advised  -- CIWA  CKD stage IIIa -- Cr baseline 1.4-1.6 -- follow up BMET  Risk Assessment/Risk Scores:      New York Heart Association (NYHA) Functional Class NYHA Class II   For questions or updates, please contact [facility] Please consult [email or web address] for contact info under    Signed, Laverda Page, NP  03/03/2023 2:41 PM  Patient seen, examined. Available data reviewed. Agree with findings, assessment, and plan as outlined by Laverda Page, NP.  The patient is independently interviewed and examined.  He is alert, oriented, in no distress.  HEENT is normal, JVP is normal, lungs are clear bilaterally, heart is regular rate and rhythm with no murmur gallop, abdomen is soft and nontender, extremities have no edema.  High-sensitivity troponin is mildly elevated at 152 and 176 and a flat trend with no signs or symptoms of acute coronary syndrome.  BNP is elevated at 218.  Other lab data is reviewed and the only other pertinent finding is a creatinine of 1.67 with estimated GFR of 47, stable renal  function compared to past studies.  The patient appears to have acute on chronic combined systolic and diastolic heart failure with suspected hypertensive heart disease as well as hypertrophic cardiomyopathy.  Echo has been completed and currently is pending.  The patient is clinically improved after a single dose of IV furosemide.  He is now on carvedilol and spironolactone as well.  Would add Entresto tomorrow as long as his renal function is stable with IV diuresis.  Will await the results of his 2D echocardiogram.  He is counseled regarding the importance  of medication adherence.  He has been off of all of his medical therapy now for over a year.  I also discussed with him his past evaluation by Dr. Lalla Brothers and recommendation for ICD for primary prevention of sudden cardiac death in the setting of LV dysfunction and hypertrophic cardiomyopathy with severe septal hypertrophy as well as extensive scarring from cardiac MRI in 2022.  The patient denies any personal history of syncope and no family history of sudden cardiac death.  He reports that he is not interested in pursuing an ICD at this time.  Plan: Continue IV diuresis with furosemide Start carvedilol and spironolactone Start Entresto tomorrow if renal function stable Continued education on importance of lifestyle modification and medication adherence Await 2D echo interpretation  Tonny Bollman, M.D. 03/03/2023 5:29 PM

## 2023-03-03 NOTE — ED Notes (Signed)
ED TO INPATIENT HANDOFF REPORT  ED Nurse Name and Phone #: Dahlia Client [phone]  S Name/Age/Gender Dustin Fowler 62 y.o. male Room/Bed: 021C/021C  Code Status   Code Status: Prior  Home/SNF/Other Home Patient oriented to: self, place, time, and situation Is this baseline? Yes   Triage Complete: Triage complete  Chief Complaint Difficulty Breathing, Congestion  Triage Note Pt. Stated, Lavenia Atlas had congestion and coughing up mucous and having SOB  , this started about a month ago. I also have CHF . Pt has been out of medication for a year.   Allergies No Known Allergies  Level of Care/Admitting Diagnosis ED Disposition     ED Disposition  Admit   Condition  --   Comment  The patient appears reasonably stabilized for admission considering the current resources, flow, and capabilities available in the ED at this time, and I doubt any other [facility] requiring further screening and/or treatment in the ED prior to admission is  present.          B Medical/Surgery History Past Medical History:  Diagnosis Date   CAD (coronary artery disease)    CHF (congestive heart failure) (HCC)    Hypertension    Prediabetes    Past Surgical History:  Procedure Laterality Date   RIGHT/LEFT HEART CATH AND CORONARY ANGIOGRAPHY N/A 07/27/2020   Procedure: RIGHT/LEFT HEART CATH AND CORONARY ANGIOGRAPHY;  Surgeon: Yvonne Kendall, MD;  Location: [facility];  Service: Cardiovascular;  Laterality: N/A;     A IV Location/Drains/Wounds Patient Lines/Drains/Airways Status     Active Line/Drains/Airways     Name Placement date Placement time Site Days   Peripheral IV 03/03/23 18 G Right Antecubital 03/03/23  0849  Antecubital  less than 1            Intake/Output Last 24 hours  Intake/Output Summary (Last 24 hours) at 03/03/2023 1100 Last data filed at 03/03/2023 1059 Gross per 24 hour  Intake --  Output 825 ml  Net -825 ml    Labs/Imaging Results for orders placed or  performed during the hospital encounter of 03/03/23 (from the past 48 hour(s))  Comprehensive metabolic panel     Status: Abnormal   Collection Time: 03/03/23  8:49 AM  Result Value Ref Range   Sodium 140 135 - 145 mmol/L   Potassium 4.5 3.5 - 5.1 mmol/L   Chloride 105 98 - 111 mmol/L   CO2 23 22 - 32 mmol/L   Glucose, Bld 111 (H) 70 - 99 mg/dL    Comment: Glucose reference range applies only to samples taken after fasting for at least 8 hours.   BUN 20 6 - 20 mg/dL   Creatinine, Ser 6.04 (H) 0.61 - 1.24 mg/dL   Calcium 8.7 (L) 8.9 - 10.3 mg/dL   Total Protein 6.9 6.5 - 8.1 g/dL   Albumin 3.3 (L) 3.5 - 5.0 g/dL   AST 30 15 - 41 U/L   ALT 28 0 - 44 U/L   Alkaline Phosphatase 61 38 - 126 U/L   Total Bilirubin 0.8 0.3 - 1.2 mg/dL   GFR, Estimated 47 (L) >60 mL/min    Comment: (NOTE) Calculated using the CKD-EPI Creatinine Equation (2021)    Anion gap 12 5 - 15    Comment: Performed at [facility], [address] [place], [place] [address]  CBC with Differential     Status: Abnormal   Collection Time: 03/03/23  8:49 AM  Result Value Ref Range  WBC 6.1 4.0 - 10.5 K/uL   RBC 6.05 (H) 4.22 - 5.81 MIL/uL   Hemoglobin 14.2 13.0 - 17.0 g/dL   HCT 82.9 56.2 - 13.0 %   MCV 76.4 (L) 80.0 - 100.0 fL   MCH 23.5 (L) 26.0 - 34.0 pg   MCHC 30.7 30.0 - 36.0 g/dL   RDW 86.5 (H) 78.4 - 69.6 %   Platelets 217 150 - 400 K/uL   nRBC 0.0 0.0 - 0.2 %   Neutrophils Relative % 69 %   Neutro Abs 4.2 1.7 - 7.7 K/uL   Lymphocytes Relative 20 %   Lymphs Abs 1.2 0.7 - 4.0 K/uL   Monocytes Relative 10 %   Monocytes Absolute 0.6 0.1 - 1.0 K/uL   Eosinophils Relative 1 %   Eosinophils Absolute 0.0 0.0 - 0.5 K/uL   Basophils Relative 0 %   Basophils Absolute 0.0 0.0 - 0.1 K/uL   Immature Granulocytes 0 %   Abs Immature Granulocytes 0.02 0.00 - 0.07 K/uL    Comment: Performed at [facility], [address] [place], [place] [address]  Troponin I (High Sensitivity)     Status: Abnormal    Collection Time: 03/03/23  8:49 AM  Result Value Ref Range   Troponin I (High Sensitivity) 152 (HH) <18 ng/L    Comment: CRITICAL RESULT CALLED TO, READ BACK BY AND VERIFIED WITH Rae Roam, RN 1002 03/03/23 L. KLAR (NOTE) Elevated high sensitivity troponin I (hsTnI) values and significant  changes across serial measurements may suggest ACS but many other  chronic and acute conditions are known to elevate hsTnI results.  Refer to the "Links" section for chest pain algorithms and additional  guidance. Performed at [facility], [address], [place], [place] [address]   SARS Coronavirus 2 by RT PCR (hospital order, performed in [facility] hospital lab) *cepheid single result test* Anterior Nasal Swab     Status: None   Collection Time: 03/03/23  8:49 AM   Specimen: Anterior Nasal Swab  Result Value Ref Range   SARS Coronavirus 2 by RT PCR NEGATIVE NEGATIVE    Comment: Performed at [facility], [address], [place], [place] [address]  Brain natriuretic peptide     Status: Abnormal   Collection Time: 03/03/23  8:49 AM  Result Value Ref Range   B Natriuretic Peptide 217.8 (H) 0.0 - 100.0 pg/mL    Comment: Performed at [facility], [address], [place], [place] [address]   DG Chest Portable 1 View  Result Date: 03/03/2023 CLINICAL DATA:  Dyspnea EXAM: PORTABLE CHEST 1 VIEW COMPARISON:  09/13/2022 FINDINGS: Enlarged cardiopericardial silhouette. The inferior costophrenic angles are clipped off the edge of the film. No pneumothorax or effusion. There are some interstitial changes with some mild central vascular congestion. Overlapping cardiac leads. Degenerative changes of the spine. IMPRESSION: Enlarged cardiopericardial silhouette with some vascular congestion and some interstitial changes. Electronically Signed   By: Karen Kays M.D.   On: 03/03/2023 10:17    Pending Labs Unresulted Labs (From admission, onward)    None       Vitals/Pain Today's Vitals    03/03/23 0900 03/03/23 1004 03/03/23 1006 03/03/23 1036  BP: (!) 174/112  (!) 166/77 (!) 168/88  Pulse:   (!) 106 (!) 109  Resp: (!) 21 (!) 22 (!) 21 (!) 24  Temp:   98.4 F (36.9 C)   TempSrc:   Oral   SpO2: 99%  100% 100%  Weight:      Height:      PainSc:        Isolation Precautions Airborne and Contact precautions  Medications Medications  carvedilol (COREG) tablet 12.5 mg (has no administration in time range)  hydrALAZINE (APRESOLINE) tablet 50 mg (50 mg Oral Given 03/03/23 0917)  spironolactone (ALDACTONE) tablet 25 mg (25 mg Oral Given 03/03/23 0917)  aspirin EC tablet 325 mg (325 mg Oral Given 03/03/23 1038)  ipratropium-albuterol (DUONEB) 0.5-2.5 (3) MG/3ML nebulizer solution 3 mL (3 mLs Nebulization Given 03/03/23 0916)  furosemide (LASIX) injection 40 mg (40 mg Intravenous Given 03/03/23 1027)  ipratropium-albuterol (DUONEB) 0.5-2.5 (3) MG/3ML nebulizer solution 6 mL (6 mLs Nebulization Given 03/03/23 1039)    Mobility walks     Focused Assessments Cardiac Assessment Handoff:  Cardiac Rhythm: Sinus tachycardia No results found for: "CKTOTAL", "CKMB", "CKMBINDEX", "TROPONINI" Lab Results  Component Value Date   DDIMER 3.32 (H) 07/24/2020   Does the Patient currently have chest pain? No   , Pulmonary Assessment Handoff:  Lung sounds:   O2 Device: Room Air      R Recommendations: See Admitting Provider Note  Report given to:   Additional Notes:

## 2023-03-03 NOTE — ED Triage Notes (Addendum)
Pt. Stated, Ive had congestion and coughing up mucous and having SOB  , this started about a month ago. I also have CHF . Pt has been out of medication for a year.

## 2023-03-03 NOTE — H&P (Signed)
Date: 03/03/2023               Patient Name:  Dustin Fowler MRN: [id number]  DOB: July 28, 1960 Age / Sex: 62 y.o., male   PCP: Marcine Matar, MD         Medical Service: Internal Medicine Teaching Service         Attending Physician: Dr. Mercie Eon, MD      First Contact: Dr. Lovie Macadamia MD Pager [phone]    Second Contact: Dr. Rocky Morel, DO Pager [phone]         After Hours (After 5p/  First Contact Pager: [phone]  weekends / holidays): Second Contact Pager: [phone]   SUBJECTIVE   Chief Complaint: Dyspnea  History of Present Illness: Dustin Fowler. [name] is a 62 year old gentleman with history of hypertrophic cardiomyopathy, mixed systolic diastolic heart failure, coronary artery disease, tobacco use disorder, and CKD 3a. He presented to the emergency department today with a 1-2 month history of progressive dyspnea.  He says at baseline he is able to walk about a block before becoming dyspneic.  He has 1-2 pillow orthopnea, mild leg swelling, no chest pain, no chest pressure, chronic cough productive of whitish or green mucousy sputum especially in the mornings at baseline.  Over the last month he has noticed increase in his shortness of breath, without significant increase in his orthopnea or leg swelling.  He mainly says that he is unable to tolerate activity as he previously was which prompted him to come into the emergency department.    He has not taken any medications in about a year, and has been lost to cardiology follow-up for his heart conditions.  He attributes this mainly to lack of time with work, side effects of medications, and difficulty getting refills.  He does also endorse polyuria without polydipsia.  He feels that this might be due to his beer intake which increases his urinary output.  He denies any other symptoms such as fever, chills, nausea, vomiting, oliguria, diarrhea, blood in his urine, decreased urine output, blood in his stools, night  sweats or unexpected weight loss.  ED Course:  Patient presented with bilateral wheezing, crackles, shortness of breath.  He was given 3 DuoNebs for wheezing and received 25 mg of spironolactone, 40 mg of IV Lasix.  Troponins were elevated to 152 and the patient was given 325 mg of aspirin.  Cardiology was consulted.  BNP was elevated to 218, and chest x-ray showed vascular congestion, cardiomegaly, and interstitial changes. CBC notable for increased RBC, decreased MCV, decreased MCH, and increased RDW which may be compatible with thalassemia.   CMP - elevated glucose, cr at baseline CKD 3a, trivial low calcium & low albumin. COVID-negative.  HIV pending.    Meds:  No outpatient medications have been marked as taking for the 03/03/23 encounter [facility] Encounter).    Past Medical History  Past Surgical History:  Procedure Laterality Date   RIGHT/LEFT HEART CATH AND CORONARY ANGIOGRAPHY N/A 07/27/2020   Procedure: RIGHT/LEFT HEART CATH AND CORONARY ANGIOGRAPHY;  Surgeon: Yvonne Kendall, MD;  Location: [facility];  Service: Cardiovascular;  Laterality: N/A;    Social:  Occupation: Holiday representative Level of Function: Independent PCP: Jonah Blue, MD  Substances:  Tobacco: 20 pack year history - 1/2 pack a day smoker.  Alcohol: 2 x 12oz beer, at time high gravity daily  denies drug use or history of drug use.  Family History: Denies history of sudden unexplained or cardiac death in  family.  Denies history of diabetes or heart failure family.  Allergies: Allergies as of 03/03/2023   (No Known Allergies)    Review of Systems: A complete ROS was negative except as per HPI.   OBJECTIVE:   Physical Exam: Blood pressure (!) 168/88, pulse (!) 109, temperature 98.4 F (36.9 C), temperature source Oral, resp. rate (!) 24, height 5\' 10"  (1.778 m), weight 102.1 kg, SpO2 100%.   Constitutional: well-appearing mildly uncomfortable Cardiovascular: regular rate and rhythm, no m/r/g  JVD elevated to about 12 cm. Pulmonary/Chest: normal work of breathing on room air, no wheezing appreciated for [place] following DuoNeb x 3.  Crackles bilaterally at the lung bases. Abdominal: soft, non-tender, mildly-distended Extremities.  1+ edema to the midshin bilaterally. Warm and dry Psych: Normal mood and affect  Labs: CBC    Component Value Date/Time   WBC 6.1 03/03/2023 0849   RBC 6.05 (H) 03/03/2023 0849   HGB 14.2 03/03/2023 0849   HCT 46.2 03/03/2023 0849   PLT 217 03/03/2023 0849   MCV 76.4 (L) 03/03/2023 0849   MCH 23.5 (L) 03/03/2023 0849   MCHC 30.7 03/03/2023 0849   RDW 17.3 (H) 03/03/2023 0849   LYMPHSABS 1.2 03/03/2023 0849   MONOABS 0.6 03/03/2023 0849   EOSABS 0.0 03/03/2023 0849   BASOSABS 0.0 03/03/2023 0849     CMP     Component Value Date/Time   NA 140 03/03/2023 0849   NA 140 04/13/2021 1647   K 4.5 03/03/2023 0849   CL 105 03/03/2023 0849   CO2 23 03/03/2023 0849   GLUCOSE 111 (H) 03/03/2023 0849   BUN 20 03/03/2023 0849   BUN 18 04/13/2021 1647   CREATININE 1.67 (H) 03/03/2023 0849   CALCIUM 8.7 (L) 03/03/2023 0849   PROT 6.9 03/03/2023 0849   PROT 7.5 04/13/2021 1647   ALBUMIN 3.3 (L) 03/03/2023 0849   ALBUMIN 4.4 04/13/2021 1647   AST 30 03/03/2023 0849   ALT 28 03/03/2023 0849   ALKPHOS 61 03/03/2023 0849   BILITOT 0.8 03/03/2023 0849   BILITOT 0.5 04/13/2021 1647   GFRNONAA 47 (L) 03/03/2023 0849    Imaging: CXR: Vascular congestion, Interstitial changes, Cardiomegaly.  EKG: personally reviewed my interpretation is left ventricular hypertrophy, possible bundle branch block. Unchanged from prior.  ASSESSMENT & PLAN:   Assessment & Plan by Problem: Principal Problem:   Acute exacerbation of CHF (congestive heart failure) (HCC)   Dustin Fowler is a 62 y.o. gentleman with history of hypertrophic cardiomyopathy, mixed systolic diastolic heart failure, coronary artery disease, tobacco use disorder, possible COPD, and CKD 3a. He  presented to the emergency department today with a 1 month history of progressive dyspnea and is admitted for heart failure exacerbation on hospital day 0  Hypertrophic Cardiomyopathy Mixed Systolic, Diastolic Heart Failure (EF 40-45%) CAD HTN Gentleman with a extensive cardiac workup including echo, catch, and cardiac MR in the last few years as below. Discussed ICD placement with the patient d/t their hypertrophic cardiomyopathy, patient says he would like to avoid surgery, and not interest in ICD placement at this time. Cardiology will consult. Appreciate recommendations. Patient would benefit form reestablishing with cardiology as well as close PCP follow up. Current constellations of signs and symptoms c/w acute HF exacerbation.  Echo (08/27/2020) Left Ventricle: LVEF 40 to 45%. The left ventricle has mildly decreased function. W/ regional wall motion abnormalities. There is severe concentric left ventricular hypertrophy. Grade I diastolic dysfunction (impaired relaxation). Elevated left atrial pressure. RA and RV  normal. Trivial MR, Trivial TR.  Right / Left Heart Cath (07/27/2020) Severe single vessel coronary artery disease with chronic total/subtotal occlusion of the distal RCA/rPLA with bridging collaterals.  Mild to moderate, non-obstructive CAD noted in the left coronary artery. Mildly elevated left and right heart filling pressures. Normal Fick cardiac output/index.  Cardiac MRI (10/19/2020) 1. Hypertrophic cardiomyopathy, variant morphology with diffuse hypertrophy. See findings. Maximal wall thickness 28 mm in the basal anterior and anterolateral and mid lateral wall.  2. No LV apical pouch. No LVOT obstruction. No systolic anterior motion of the mitral valve.  3. Mild-moderately reduced LV systolic function with normal LV chamber size, LVEF 42%. Mild inferior wall hypokinesis. Mild abnormal septal motion at the basal septum, likely secondary to conduction delay.  4. Diffuse  post contrast delayed myocardial enhancement from base to apex, most prominent in areas of myocardial hypertrophy, visually >15% of myocardial mass. Focal delayed enhancement at the apex in the anterior wall.  5. Mildly reduced right ventricular systolic function, normal right ventricular chamber size. RVEF 43%.  Plan:  - Appreciate Cardiology input  - S/p Lasix IV 40mg   - Mindful of patient's volume status d/t HOCM - careful about preload.    - Strict ins and outs   - Daily weights  - Goal 1L net negative today. - ICD declined, but continue to discuss with patient - Lipid panel for baseline, secondary prevention  - Atorvastatin 80mg  - Cleda Daub 25mg  Daily - Entresto tomorrow - Coreg 12.5mg  BID - Trend Trops, Slightly elevated, uptrending  - Echo   Tobacco use disorder w/ chronic cough, sputum production  20 pack year history - interested in quitting. Long standing cough w/ sputum production. Not wheezing for [place] on exam today, but was wheezing in ED. S/p 3x Duoneb. 20 pack year history.  -  PFT outpatient -  Duoneb PRN wheezing -  Continue discussion of tobacco cessation.  -  Can consider Chantix at d/c or office f/u. -  Nicotine patch  Alcohol use  Patient drinks about 2 beers per day, sometimes high gravity beers around 5-6% abv. - CIWA protocol  CKD 3a Stable elevated creatinine around 1.6 - 1.7, Compatible with CKD 3a. Favored to be secondary to long standing HTN  Microcytosis Possible thalassemia given constellation of CBC findings.  - Iron panel  Polyuria Obesity Elevated glucose on CMP - Hba1c   Diet: Heart Healthy VTE: Heparin IVF: None,None Code: Full  Prior to Admission Living Arrangement: Home, living independently Anticipated Discharge Location: Home Barriers to Discharge: Heart failure exaserbation  Dispo: Admit patient to Observation with expected length of stay less than 2 midnights.  Signed: Lovie Macadamia, MD Internal Medicine Resident  PGY-1  03/03/2023, 5:07 PM

## 2023-03-03 NOTE — ED Provider Notes (Signed)
[place] EMERGENCY DEPARTMENT AT [facility] Provider Note  CSN: [id number] Arrival date & time: 03/03/23 7829  Chief Complaint(s) Shortness of Breath and Nasal Congestion  HPI Dustin Fowler is a 62 y.o. male with PMH CAD, CHF, HTN, alcohol use, recent ER visit in March 2024 with elevated troponins but left AMA who presents emergency department for evaluation of shortness of breath, wheezing, orthopnea states that symptoms have been worsening over the last 2 months but have significantly worsened in the last 1 week.  States he has not taken his medication in over a year.  Denies chest pain, abdominal pain, nausea, vomiting or other systemic symptoms.   Past Medical History Past Medical History:  Diagnosis Date   CAD (coronary artery disease)    CHF (congestive heart failure) (HCC)    Hypertension    Prediabetes    Patient Active Problem List   Diagnosis Date Noted   Influenza vaccine refused 08/21/2020   Essential hypertension 08/21/2020   Alcohol use disorder, moderate, dependence (HCC) 08/21/2020   Prediabetes 08/21/2020   Stage 3a chronic kidney disease (HCC) 08/21/2020   Hypertrophic cardiomyopathy (HCC) 08/04/2020   Coronary artery disease of native artery of native heart with stable angina pectoris (HCC) 08/04/2020   Renal insufficiency 08/04/2020   Chronic combined systolic and diastolic heart failure (HCC) 07/31/2020   Alcohol use disorder, mild, abuse 07/31/2020   Tobacco use disorder 07/31/2020   Hyperlipidemia 07/31/2020   Acute combined systolic and diastolic heart failure (HCC) 07/26/2020   Dyspnea on exertion 07/25/2020   Orthopnea 07/25/2020   Hypertensive urgency 07/25/2020   Home Medication(s) Prior to Admission medications   Medication Sig Start Date End Date Taking? Authorizing Provider  atorvastatin (LIPITOR) 80 MG tablet Take 1 tablet (80 mg total) by mouth daily. 09/11/20   Parke Poisson, MD  carvedilol (COREG) 12.5 MG tablet Take  1 tablet (12.5 mg total) by mouth 2 (two) times daily with a meal. 04/13/21   Marcine Matar, MD  dapagliflozin propanediol (FARXIGA) 10 MG TABS tablet Take 1 tablet (10 mg total) by mouth daily. 07/30/20   Bhagat, Sharrell Ku, PA  furosemide (LASIX) 40 MG tablet Take 1 tablet (40 mg total) by mouth as needed. For shortness of breath, lower extremity edema or weight gain. 05/24/21 05/24/22  Parke Poisson, MD  hydrALAZINE (APRESOLINE) 25 MG tablet TAKE 1 TABLET BY MOUTH 3 TIMES DAILY WITH A 50MG  TABLET. 08/21/20 08/21/21  Marcine Matar, MD  hydrALAZINE (APRESOLINE) 50 MG tablet Take 1 tablet (50 mg total) by mouth 3 (three) times daily. 09/11/20   Parke Poisson, MD  isosorbide mononitrate (IMDUR) 30 MG 24 hr tablet TAKE 1 TABLET (30 MG TOTAL) BY MOUTH DAILY. 07/29/20 07/29/21  Bhagat, Sharrell Ku, PA  nicotine (NICODERM CQ - DOSED IN MG/24 HOURS) 14 mg/24hr patch Place 1 patch (14 mg total) onto the skin daily. 04/13/21   Marcine Matar, MD  nitroGLYCERIN (NITROSTAT) 0.4 MG SL tablet Place 1 tablet (0.4 mg total) under the tongue every 5 (five) minutes x 3 doses as needed for chest pain. 09/11/20   Parke Poisson, MD  spironolactone (ALDACTONE) 25 MG tablet Take 1 tablet (25 mg total) by mouth daily. 09/11/20   Parke Poisson, MD  Past Surgical History Past Surgical History:  Procedure Laterality Date   RIGHT/LEFT HEART CATH AND CORONARY ANGIOGRAPHY N/A 07/27/2020   Procedure: RIGHT/LEFT HEART CATH AND CORONARY ANGIOGRAPHY;  Surgeon: Yvonne Kendall, MD;  Location: [facility] INVASIVE CV LAB;  Service: Cardiovascular;  Laterality: N/A;   Family History History reviewed. No pertinent family history.  Social History Social History   Tobacco Use   Smoking status: Every Day    Current packs/day: 0.00    Types: Cigarettes    Start date: 07/24/1980    Last  attempt to quit: 07/24/2020    Years since quitting: 2.6   Smokeless tobacco: Never   Tobacco comments:    willing to stop.   Vaping Use   Vaping status: Never Used  Substance Use Topics   Alcohol use: Yes    Alcohol/week: 4.0 standard drinks of alcohol    Types: 4 Cans of beer per week    Comment: 1 40oz can a day, everyday.   Drug use: Not Currently   Allergies Patient has no known allergies.  Review of Systems Review of Systems  HENT:  Positive for congestion.   Respiratory:  Positive for cough and shortness of breath.     Physical Exam Vital Signs  I have reviewed the triage vital signs BP (!) 197/122 (BP Location: Left Arm)   Pulse 77   Temp 98.8 F (37.1 C) (Oral)   Resp 14   Ht 5\' 10"  (1.778 m)   Wt 102.1 kg   SpO2 98%   BMI 32.28 kg/m   Physical Exam Constitutional:      General: He is not in acute distress.    Appearance: Normal appearance.  HENT:     Head: Normocephalic and atraumatic.     Nose: No congestion or rhinorrhea.  Eyes:     General:        Right eye: No discharge.        Left eye: No discharge.     Extraocular Movements: Extraocular movements intact.     Pupils: Pupils are equal, round, and reactive to light.  Cardiovascular:     Rate and Rhythm: Normal rate and regular rhythm.     Heart sounds: No murmur heard. Pulmonary:     Effort: No respiratory distress.     Breath sounds: Wheezing and rales present.  Abdominal:     General: There is no distension.     Tenderness: There is no abdominal tenderness.  Musculoskeletal:        General: Normal range of motion.     Cervical back: Normal range of motion.  Skin:    General: Skin is warm and dry.  Neurological:     General: No focal deficit present.     Mental Status: He is alert.     ED Results and Treatments Labs (all labs ordered are listed, but only abnormal results are displayed) Labs Reviewed  SARS CORONAVIRUS 2 BY RT PCR  COMPREHENSIVE METABOLIC PANEL  CBC WITH  DIFFERENTIAL/PLATELET  BRAIN NATRIURETIC PEPTIDE  TROPONIN I (HIGH SENSITIVITY)  Radiology No results found.  Pertinent labs & imaging results that were available during my care of the patient were reviewed by me and considered in my medical decision making (see MDM for details).  Medications Ordered in ED Medications  carvedilol (COREG) tablet 12.5 mg (has no administration in time range)  hydrALAZINE (APRESOLINE) tablet 50 mg (has no administration in time range)  spironolactone (ALDACTONE) tablet 25 mg (has no administration in time range)                                                                                                                                     Procedures .Critical Care  Performed by: Glendora Score, MD Authorized by: Glendora Score, MD   Critical care provider statement:    Critical care time (minutes):  30   Critical care was necessary to treat or prevent imminent or life-threatening deterioration of the following conditions:  Cardiac failure   Critical care was time spent personally by me on the following activities:  Development of treatment plan with patient or surrogate, discussions with consultants, evaluation of patient's response to treatment, examination of patient, ordering and review of laboratory studies, ordering and review of radiographic studies, ordering and performing treatments and interventions, pulse oximetry, re-evaluation of patient's condition and review of old charts   (including critical care time)  Medical Decision Making / ED Course   This patient presents to the ED for concern of shortness of breath, this involves an extensive number of treatment options, and is a complaint that carries with it a high risk of complications and morbidity.  The differential diagnosis includes Pe, PTX, Pulmonary Edema, ARDS,  COPD/Asthma, ACS, CHF exacerbation, Arrhythmia, Pericardial Effusion/Tamponade, Anemia, Sepsis, Acidosis/Hypercapnia, Anxiety, Viral URI  MDM: Patient seen in the emergency room for evaluation of shortness of breath.  Physical exam with wheezes bilaterally, faint rales at the bases but is otherwise unremarkable.  Laboratory evaluation with a creatinine of 1.67, BNP 217.8, high-sensitivity troponin elevated at 152.  Aspirin given.  ECG nonischemic and overall unchanged from previous.  Chest x-ray with vascular congestion and a Lasix initiated.  Patient given 3 DuoNebs for wheezing.  I spoke with cardiology team will evaluate the patient routinely for suspected demand ischemia.   Additional history obtained:  -External records from outside source obtained and reviewed including: Chart review including previous notes, labs, imaging, consultation notes   Lab Tests: -I ordered, reviewed, and interpreted labs.   The pertinent results include:   Labs Reviewed  SARS CORONAVIRUS 2 BY RT PCR  COMPREHENSIVE METABOLIC PANEL  CBC WITH DIFFERENTIAL/PLATELET  BRAIN NATRIURETIC PEPTIDE  TROPONIN I (HIGH SENSITIVITY)      EKG   EKG Interpretation Date/Time:  Friday March 03 2023 08:42:08 EDT Ventricular Rate:  116 PR Interval:  169 QRS Duration:  135 QT Interval:  355 QTC Calculation: 494 R Axis:   131  Text Interpretation: Sinus tachycardia Probable left  atrial enlargement left ventricular hypertrophy Baseline wander in lead(s) V4 No significant change since last tracing Confirmed by Timiya Howells ([id number]) on 03/03/2023 8:44:57 AM         Imaging Studies ordered: I ordered imaging studies including chest x-ray I independently visualized and interpreted imaging. I agree with the radiologist interpretation   Medicines ordered and prescription drug management: Meds ordered this encounter  Medications   carvedilol (COREG) tablet 12.5 mg   hydrALAZINE (APRESOLINE) tablet 50 mg    spironolactone (ALDACTONE) tablet 25 mg    -I have reviewed the patients home medicines and have made adjustments as needed  Critical interventions Aspirin, cardiology consultation  Consultations Obtained: I requested consultation with the cardiology team,  and discussed lab and imaging findings as well as pertinent plan - they recommend: Recommendations pending   Cardiac Monitoring: The patient was maintained on a cardiac monitor.  I personally viewed and interpreted the cardiac monitored which showed an underlying rhythm of: NSR  Social Determinants of Health:  Factors impacting patients care include: Medication noncompliance   Reevaluation: After the interventions noted above, I reevaluated the patient and found that they have :stayed the same  Co morbidities that complicate the patient evaluation  Past Medical History:  Diagnosis Date   CAD (coronary artery disease)    CHF (congestive heart failure) (HCC)    Hypertension    Prediabetes       Dispostion: I considered admission for this patient, and due to NSTEMI with shortness of breath and fluid overload patient require hospital admission     Final Clinical Impression(s) / ED Diagnoses Final diagnoses:  None     @PCDICTATION @    Glendora Score, MD 03/03/23 1639

## 2023-03-04 ENCOUNTER — Other Ambulatory Visit (HOSPITAL_COMMUNITY): Payer: Self-pay

## 2023-03-04 DIAGNOSIS — I5023 Acute on chronic systolic (congestive) heart failure: Secondary | ICD-10-CM

## 2023-03-04 DIAGNOSIS — I5043 Acute on chronic combined systolic (congestive) and diastolic (congestive) heart failure: Secondary | ICD-10-CM

## 2023-03-04 DIAGNOSIS — F17210 Nicotine dependence, cigarettes, uncomplicated: Secondary | ICD-10-CM | POA: Diagnosis not present

## 2023-03-04 DIAGNOSIS — I25118 Atherosclerotic heart disease of native coronary artery with other forms of angina pectoris: Secondary | ICD-10-CM | POA: Diagnosis not present

## 2023-03-04 DIAGNOSIS — I110 Hypertensive heart disease with heart failure: Secondary | ICD-10-CM

## 2023-03-04 LAB — LIPID PANEL
Cholesterol: 192 mg/dL (ref 0–200)
HDL: 57 mg/dL (ref >40)
LDL Cholesterol: 119 mg/dL — ABNORMAL HIGH (ref 0–99)
Total CHOL/HDL Ratio: 3.4 ratio
Triglycerides: 80 mg/dL (ref <150)
VLDL: 16 mg/dL (ref 0–40)

## 2023-03-04 LAB — ECHOCARDIOGRAM COMPLETE
AR max vel: 3.19 cm2
AV Peak grad: 8.6 mmHg
Ao pk vel: 1.47 m/s
Area-P 1/2: 5.02 cm2
Height: 70 in
MV M vel: 3.77 m/s
MV Peak grad: 56.9 mmHg
S' Lateral: 4.4 cm
Weight: 3600 [oz_av]

## 2023-03-04 LAB — BASIC METABOLIC PANEL
Anion gap: 9 (ref 5–15)
BUN: 21 mg/dL — ABNORMAL HIGH (ref 6–20)
CO2: 26 mmol/L (ref 22–32)
Calcium: 8.5 mg/dL — ABNORMAL LOW (ref 8.9–10.3)
Chloride: 103 mmol/L (ref 98–111)
Creatinine, Ser: 1.46 mg/dL — ABNORMAL HIGH (ref 0.61–1.24)
GFR, Estimated: 55 mL/min — ABNORMAL LOW (ref >60)
Glucose, Bld: 101 mg/dL — ABNORMAL HIGH (ref 70–99)
Potassium: 4.1 mmol/L (ref 3.5–5.1)
Sodium: 138 mmol/L (ref 135–145)

## 2023-03-04 LAB — IRON AND TIBC
Iron: 73 ug/dL (ref 45–182)
Saturation Ratios: 22 % (ref 17.9–39.5)
TIBC: 330 ug/dL (ref 250–450)
UIBC: 257 ug/dL

## 2023-03-04 LAB — HIV ANTIBODY (ROUTINE TESTING W REFLEX): HIV Screen 4th Generation wRfx: NONREACTIVE

## 2023-03-04 LAB — FERRITIN: Ferritin: 54 ng/mL (ref 24–336)

## 2023-03-04 MED ORDER — ASPIRIN 81 MG PO TBEC: 81.0000 mg | DELAYED_RELEASE_TABLET | Freq: Every day | ORAL | Status: DC

## 2023-03-04 MED ORDER — ALBUTEROL SULFATE HFA 108 (90 BASE) MCG/ACT IN AERS
2.0000 | INHALATION_SPRAY | Freq: Four times a day (QID) | RESPIRATORY_TRACT | 2 refills | Status: DC | PRN
  Filled 2023-03-04: qty 6.7, 25d supply, fill #0

## 2023-03-04 MED ORDER — ASPIRIN 81 MG PO TBEC
81.0000 mg | DELAYED_RELEASE_TABLET | Freq: Every day | ORAL | 12 refills | Status: DC
  Filled 2023-03-04: qty 30, 30d supply, fill #0
  Filled 2023-04-06: qty 30, 30d supply, fill #1

## 2023-03-04 MED ORDER — SACUBITRIL-VALSARTAN 24-26 MG PO TABS
1.0000 | ORAL_TABLET | Freq: Two times a day (BID) | ORAL | 0 refills | Status: DC
  Filled 2023-03-04: qty 60, 30d supply, fill #0

## 2023-03-04 MED ORDER — DAPAGLIFLOZIN PROPANEDIOL 10 MG PO TABS
10.0000 mg | ORAL_TABLET | Freq: Every day | ORAL | 3 refills | Status: DC
  Filled 2023-03-04: qty 30, 30d supply, fill #0

## 2023-03-04 MED ORDER — NITROGLYCERIN 0.4 MG SL SUBL
0.4000 mg | SUBLINGUAL_TABLET | SUBLINGUAL | 6 refills | Status: AC | PRN
  Filled 2023-03-04: qty 25, 8d supply, fill #0

## 2023-03-04 MED ORDER — EMPAGLIFLOZIN 10 MG PO TABS
10.0000 mg | ORAL_TABLET | Freq: Every day | ORAL | 2 refills | Status: DC
Start: 2023-03-04 — End: 2023-03-04
  Filled 2023-03-04: qty 30, 30d supply, fill #0

## 2023-03-04 MED ORDER — CARVEDILOL 12.5 MG PO TABS
12.5000 mg | ORAL_TABLET | Freq: Two times a day (BID) | ORAL | 6 refills | Status: DC
  Filled 2023-03-04: qty 60, 30d supply, fill #0
  Filled 2023-04-06: qty 60, 30d supply, fill #1

## 2023-03-04 MED ORDER — FOLIC ACID 1 MG PO TABS
1.0000 mg | ORAL_TABLET | Freq: Every day | ORAL | 3 refills | Status: AC
Start: 2023-03-05 — End: ?
  Filled 2023-03-04: qty 30, 30d supply, fill #0

## 2023-03-04 MED ORDER — NICOTINE 14 MG/24HR TD PT24
14.0000 mg | MEDICATED_PATCH | Freq: Every day | TRANSDERMAL | 1 refills | Status: DC
  Filled 2023-03-04: qty 28, 28d supply, fill #0

## 2023-03-04 MED ORDER — ATORVASTATIN CALCIUM 80 MG PO TABS
80.0000 mg | ORAL_TABLET | Freq: Every day | ORAL | 3 refills | Status: AC
Start: 2023-03-04 — End: ?
  Filled 2023-03-04: qty 30, 30d supply, fill #0

## 2023-03-04 MED ORDER — FUROSEMIDE 40 MG PO TABS
40.0000 mg | ORAL_TABLET | ORAL | 1 refills | Status: AC
Start: 2023-03-04 — End: ?
  Filled 2023-03-04: qty 4, 28d supply, fill #0

## 2023-03-04 MED ORDER — SPIRONOLACTONE 25 MG PO TABS
25.0000 mg | ORAL_TABLET | Freq: Every day | ORAL | 2 refills | Status: DC
  Filled 2023-03-04: qty 30, 30d supply, fill #0
  Filled 2023-04-06 – 2023-04-14 (×2): qty 30, 30d supply, fill #1

## 2023-03-04 MED ORDER — ADULT MULTIVITAMIN W/MINERALS CH
1.0000 | ORAL_TABLET | Freq: Every day | ORAL | 3 refills | Status: DC
  Filled 2023-03-04: qty 90, 90d supply, fill #0

## 2023-03-04 MED ORDER — SACUBITRIL-VALSARTAN 24-26 MG PO TABS
1.0000 | ORAL_TABLET | Freq: Two times a day (BID) | ORAL | Status: DC
  Administered 2023-03-04: 1 via ORAL
  Filled 2023-03-04: qty 1

## 2023-03-04 MED ORDER — THIAMINE HCL 100 MG PO TABS
100.0000 mg | ORAL_TABLET | Freq: Every day | ORAL | 3 refills | Status: DC
  Filled 2023-03-04: qty 90, 90d supply, fill #0

## 2023-03-04 MED ORDER — FUROSEMIDE 40 MG PO TABS
40.0000 mg | ORAL_TABLET | ORAL | Status: DC
  Administered 2023-03-04: 40 mg via ORAL
  Filled 2023-03-04: qty 1

## 2023-03-04 NOTE — TOC Progression Note (Signed)
Transition of Care [facility]) - Progression Note    Patient Details  Name: Dustin Fowler MRN: [id number] Date of Birth: [birth year]  Transition of Care [facility]) CM/SW Contact  Leander Rams, LCSW Phone Number: 03/04/2023, 1:23 PM  Clinical Narrative:    CSW met with pt at bedside to provide community resources. Pt explained the food resources packet and application for transportation to medical appointments.         Expected Discharge Plan and Services         Expected Discharge Date: 03/04/23                                     Social Determinants of Health (SDOH) Interventions SDOH Screenings   Food Insecurity: No Food Insecurity (03/03/2023)  Housing: Low Risk  (03/03/2023)  Transportation Needs: No Transportation Needs (03/03/2023)  Utilities: Not At Risk (03/03/2023)  Alcohol Screen: Low Risk  (07/27/2020)  Depression (PHQ2-9): Low Risk  (08/21/2020)  Financial Resource Strain: High Risk (07/27/2020)  Physical Activity: Insufficiently Active (07/27/2020)  Tobacco Use: High Risk (03/03/2023)    Readmission Risk Interventions     No data to display        Oletta Lamas, MSW, LCSWA, LCASA Transitions of Care  Clinical Social Worker I

## 2023-03-04 NOTE — Progress Notes (Addendum)
Progress Note  Patient Name: Dustin Fowler Date of Encounter: 03/04/2023  Primary Cardiologist: Parke Poisson, MD   Subjective   Patient seen examined his bedside.  He is lying in bed when I arrived with complaints at this time.  Inpatient Medications    Scheduled Meds:  aspirin EC  325 mg Oral Daily   atorvastatin  80 mg Oral Daily   carvedilol  12.5 mg Oral BID WC   folic acid  1 mg Oral Daily   multivitamin with minerals  1 tablet Oral Daily   nicotine  14 mg Transdermal Daily   rivaroxaban  10 mg Oral QHS   sacubitril-valsartan  1 tablet Oral BID   spironolactone  25 mg Oral Daily   thiamine  100 mg Oral Daily   Or   thiamine  100 mg Intravenous Daily   Continuous Infusions:  PRN Meds: ipratropium-albuterol   Vital Signs    Vitals:   03/03/23 2010 03/03/23 2350 03/04/23 0415 03/04/23 0612  BP: (!) 165/111 (!) 148/91 (!) 148/93   Pulse: 82 82 (!) 56   Resp: (!) 21 18 19    Temp: 98.2 F (36.8 C) 98 F (36.7 C) 97.9 F (36.6 C)   TempSrc: Oral Oral Oral   SpO2: 99% 99% 100%   Weight:    107.3 kg  Height:        Intake/Output Summary (Last 24 hours) at 03/04/2023 0821 Last data filed at 03/04/2023 1610 Gross per 24 hour  Intake --  Output 2550 ml  Net -2550 ml   Filed Weights   03/03/23 0838 03/04/23 0612  Weight: 102.1 kg 107.3 kg    Telemetry    Sinus rhythm- Personally Reviewed  ECG    None today- Personally Reviewed  Physical Exam    General: Comfortable Head: Atraumatic, normal size  Eyes: PEERLA, EOMI  Neck: Supple, normal JVD Cardiac: Normal S1, S2; RRR; no murmurs, rubs, or gallops Lungs: Clear to auscultation bilaterally Abd: Soft, nontender, no hepatomegaly  Ext: warm, no edema Musculoskeletal: No deformities, BUE and BLE strength normal and equal Skin: Warm and dry, no rashes   Neuro: Alert and oriented to person, place, time, and situation, CNII-XII grossly intact, no focal deficits  Psych: Normal mood and affect    Labs    Chemistry Recent Labs  Lab 03/03/23 0849 03/04/23 0310  NA 140 138  K 4.5 4.1  CL 105 103  CO2 23 26  GLUCOSE 111* 101*  BUN 20 21*  CREATININE 1.67* 1.46*  CALCIUM 8.7* 8.5*  PROT 6.9  --   ALBUMIN 3.3*  --   AST 30  --   ALT 28  --   ALKPHOS 61  --   BILITOT 0.8  --   GFRNONAA 47* 55*  ANIONGAP 12 9     Hematology Recent Labs  Lab 03/03/23 0849  WBC 6.1  RBC 6.05*  HGB 14.2  HCT 46.2  MCV 76.4*  MCH 23.5*  MCHC 30.7  RDW 17.3*  PLT 217    Cardiac EnzymesNo results for input(s): "TROPONINI" in the last 168 hours. No results for input(s): "TROPIPOC" in the last 168 hours.   BNP Recent Labs  Lab 03/03/23 0849  BNP 217.8*     DDimer No results for input(s): "DDIMER" in the last 168 hours.   Radiology    ECHOCARDIOGRAM COMPLETE  Result Date: 03/04/2023    ECHOCARDIOGRAM REPORT   Patient Name:   Dustin Fowler Date of Exam:  03/03/2023 Medical Rec #:  [id number]          Height:       70.0 in Accession #:    [id number]         Weight:       225.0 lb Date of Birth:  Nov 17, 1960          BSA:          2.194 m Patient Age:    61 years           BP:           197/96 mmHg Patient Gender: M                  HR:           106 bpm. Exam Location:  Inpatient Procedure: 2D Echo, Cardiac Doppler and Color Doppler Indications:    Dyspnea R06.00  History:        Patient has prior history of Echocardiogram examinations, most                 recent 08/27/2020. CHF and Hypertrophic Cardiomyopathy, CAD,                 Signs/Symptoms:Dyspnea; Risk Factors:Hypertension and Current                 Smoker. CKD, stage 3.  Sonographer:    Lucendia Herrlich Referring Phys: [id number] JULIE MACHEN  Sonographer Comments: Patient refused Definity. IMPRESSIONS  1. Left ventricular ejection fraction, by estimation, is 30 to 35%. The left ventricle has moderately decreased function. The left ventricle demonstrates regional wall motion abnormalities (see scoring diagram/findings for  description). The left ventricular internal cavity size was moderately dilated. There is severe left ventricular hypertrophy. Left ventricular diastolic parameters are indeterminate.  2. Right ventricular systolic function is normal. The right ventricular size is normal. There is normal pulmonary artery systolic pressure.  3. Left atrial size was moderately dilated.  4. The mitral valve is grossly normal. Mild mitral valve regurgitation. No evidence of mitral stenosis.  5. The aortic valve is grossly normal. Unable to determine aortic valve morphology due to image quality. Aortic valve regurgitation is not visualized. No aortic stenosis is present.  6. The inferior vena cava is dilated in size with >50% respiratory variability, suggesting right atrial pressure of 8 mmHg. FINDINGS  Left Ventricle: Left ventricular ejection fraction, by estimation, is 30 to 35%. The left ventricle has moderately decreased function. The left ventricle demonstrates regional wall motion abnormalities. The left ventricular internal cavity size was moderately dilated. There is severe left ventricular hypertrophy. Left ventricular diastolic parameters are indeterminate.  LV Wall Scoring: The inferior wall, mid inferoseptal segment, and basal inferoseptal segment are akinetic. The anterior wall, mid and distal lateral wall, entire anterior septum, mid anterolateral segment, and apical inferior segment are hypokinetic. The basal inferolateral segment and basal anterolateral segment are normal. Right Ventricle: The right ventricular size is normal. No increase in right ventricular wall thickness. Right ventricular systolic function is normal. There is normal pulmonary artery systolic pressure. The tricuspid regurgitant velocity is 1.86 m/s, and  with an assumed right atrial pressure of 8 mmHg, the estimated right ventricular systolic pressure is 21.8 mmHg. Left Atrium: Left atrial size was moderately dilated. Right Atrium: Right atrial size  was normal in size. Pericardium: There is no evidence of pericardial effusion. Mitral Valve: The mitral valve is grossly normal. Mild mitral valve regurgitation. No evidence of mitral valve stenosis. Tricuspid  Valve: The tricuspid valve is normal in structure. Tricuspid valve regurgitation is trivial. No evidence of tricuspid stenosis. Aortic Valve: The aortic valve is grossly normal. Aortic valve regurgitation is not visualized. No aortic stenosis is present. Aortic valve peak gradient measures 8.6 mmHg. Pulmonic Valve: The pulmonic valve was normal in structure. Pulmonic valve regurgitation is trivial. No evidence of pulmonic stenosis. Aorta: The aortic root is normal in size and structure. Venous: The inferior vena cava is dilated in size with greater than 50% respiratory variability, suggesting right atrial pressure of 8 mmHg. IAS/Shunts: No atrial level shunt detected by color flow Doppler.  LEFT VENTRICLE PLAX 2D LVIDd:         6.30 cm   Diastology LVIDs:         4.40 cm   LV e' medial:    6.64 cm/s LV PW:         1.30 cm   LV E/e' medial:  13.6 LV IVS:        1.50 cm   LV e' lateral:   11.90 cm/s LVOT diam:     2.40 cm   LV E/e' lateral: 7.6 LV SV:         63 LV SV Index:   29 LVOT Area:     4.52 cm  RIGHT VENTRICLE             IVC RV S prime:     18.60 cm/s  IVC diam: 2.60 cm TAPSE (M-mode): 2.5 cm LEFT ATRIUM             Index        RIGHT ATRIUM           Index LA diam:        5.80 cm 2.64 cm/m   RA Area:     17.10 cm LA Vol (A2C):   84.5 ml 38.51 ml/m  RA Volume:   39.70 ml  18.09 ml/m LA Vol (A4C):   92.5 ml 42.15 ml/m LA Biplane Vol: 98.6 ml 44.93 ml/m  AORTIC VALVE AV Area (Vmax): 3.19 cm AV Vmax:        147.00 cm/s AV Peak Grad:   8.6 mmHg LVOT Vmax:      103.77 cm/s LVOT Vmean:     64.200 cm/s LVOT VTI:       0.140 m  AORTA Ao Root diam: 3.50 cm Ao Asc diam:  3.50 cm MITRAL VALVE               TRICUSPID VALVE MV Area (PHT): 5.02 cm    TR Peak grad:   13.8 mmHg MV Decel Time: 151 msec    TR  Vmax:        186.00 cm/s MR Peak grad: 56.9 mmHg MR Vmax:      377.00 cm/s  SHUNTS MV E velocity: 90.50 cm/s  Systemic VTI:  0.14 m MV A velocity: 79.20 cm/s  Systemic Diam: 2.40 cm MV E/A ratio:  1.14 Weston Brass MD Electronically signed by Weston Brass MD Signature Date/Time: 03/04/2023/6:42:44 AM    Final    DG Chest Portable 1 View  Result Date: 03/03/2023 CLINICAL DATA:  Dyspnea EXAM: PORTABLE CHEST 1 VIEW COMPARISON:  09/13/2022 FINDINGS: Enlarged cardiopericardial silhouette. The inferior costophrenic angles are clipped off the edge of the film. No pneumothorax or effusion. There are some interstitial changes with some mild central vascular congestion. Overlapping cardiac leads. Degenerative changes of the spine. IMPRESSION: Enlarged cardiopericardial silhouette with some vascular congestion and  some interstitial changes. Electronically Signed   By: Karen Kays M.D.   On: 03/03/2023 10:17    Cardiac Studies   TTE 03/04/2023 IMPRESSIONS     1. Left ventricular ejection fraction, by estimation, is 30 to 35%. The  left ventricle has moderately decreased function. The left ventricle  demonstrates regional wall motion abnormalities (see scoring  diagram/findings for description). The left  ventricular internal cavity size was moderately dilated. There is severe  left ventricular hypertrophy. Left ventricular diastolic parameters are  indeterminate.   2. Right ventricular systolic function is normal. The right ventricular  size is normal. There is normal pulmonary artery systolic pressure.   3. Left atrial size was moderately dilated.   4. The mitral valve is grossly normal. Mild mitral valve regurgitation.  No evidence of mitral stenosis.   5. The aortic valve is grossly normal. Unable to determine aortic valve  morphology due to image quality. Aortic valve regurgitation is not  visualized. No aortic stenosis is present.   6. The inferior vena cava is dilated in size with >50%  respiratory  variability, suggesting right atrial pressure of 8 mmHg.   FINDINGS   Left Ventricle: Left ventricular ejection fraction, by estimation, is 30  to 35%. The left ventricle has moderately decreased function. The left  ventricle demonstrates regional wall motion abnormalities. The left  ventricular internal cavity size was  moderately dilated. There is severe left ventricular hypertrophy. Left  ventricular diastolic parameters are indeterminate.     LV Wall Scoring:  The inferior wall, mid inferoseptal segment, and basal inferoseptal  segment  are akinetic. The anterior wall, mid and distal lateral wall, entire  anterior  septum, mid anterolateral segment, and apical inferior segment are  hypokinetic. The basal inferolateral segment and basal anterolateral  segment  are normal.   Right Ventricle: The right ventricular size is normal. No increase in  right ventricular wall thickness. Right ventricular systolic function is  normal. There is normal pulmonary artery systolic pressure. The tricuspid  regurgitant velocity is 1.86 m/s, and   with an assumed right atrial pressure of 8 mmHg, the estimated right  ventricular systolic pressure is 21.8 mmHg.   Left Atrium: Left atrial size was moderately dilated.   Right Atrium: Right atrial size was normal in size.   Pericardium: There is no evidence of pericardial effusion.   Mitral Valve: The mitral valve is grossly normal. Mild mitral valve  regurgitation. No evidence of mitral valve stenosis.   Tricuspid Valve: The tricuspid valve is normal in structure. Tricuspid  valve regurgitation is trivial. No evidence of tricuspid stenosis.   Aortic Valve: The aortic valve is grossly normal. Aortic valve  regurgitation is not visualized. No aortic stenosis is present. Aortic  valve peak gradient measures 8.6 mmHg.   Pulmonic Valve: The pulmonic valve was normal in structure. Pulmonic valve  regurgitation is trivial. No evidence  of pulmonic stenosis.   Aorta: The aortic root is normal in size and structure.   Venous: The inferior vena cava is dilated in size with greater than 50%  respiratory variability, suggesting right atrial pressure of 8 mmHg.   IAS/Shunts: No atrial level shunt detected by color flow Doppler.    Patient Profile     62 y.o. male history of hypertrophic cardiomyopathy, depressed ejection fraction, nonobstructive coronary artery disease, hypertension, prediabetes, alcohol use admitted for heart failure exacerbation.  Assessment & Plan    Heart failure with reduced ejection fraction, EF 30 to 35% on echocardiogram  done yesterday Hypertrophic cardiomyopathy Elevated troponin Poorly controlled hypertension Chronic kidney disease  Clinically he appears to be euvolemic.  I would agree with transitioning the patient from IV Lasix to p.o.  Discharge please be cautious with diuretics given his hypertrophic cardiomyopathy.  Lasix once weekly will be appropriate. Will start Entresto today.  Continue Aldactone and spironolactone.  Hold off on BiDil for now.  I discussed with the patient about the importance for medication adherence as well as follow-up.  He will benefit from ICD for primary prevention for sudden cardiac death even more now that his heart function continue to decrease he is now 30 to 35%.  Also advised the patient about screening for his first-degree relatives.  He will need consult for social work/care navigation to help potential assistance for medications. I am not opposed to discharge today if we are able to get the patient at least 30 days of his current medications.  Time Spent Directly with Patient:   I have spent a total of 50 with the patient reviewing notes, imaging, EKGs, labs and examining the patient as well as establishing an assessment and plan that was discussed personally with the patient.  > 50% of time was spent in direct patient care.   For questions or  updates, please contact [facility] Please consult [email or web address] for contact info under Cardiology/STEMI.      Signed, Alianis Trimmer, DO  03/04/2023, 8:21 AM

## 2023-03-04 NOTE — Progress Notes (Addendum)
HD#0 Subjective:   Summary: Dustin Fowler is a 62 y.o. male with pertinent PMH of CKD 3A, hypertension, hypertrophic cardiomyopathy, mixed systolic and diastolic heart failure, coronary artery disease, and tobacco use disorder who presented with shortness of breath and is admitted for congestive heart failure exacerbation.   Overnight Events: Some continued shortness of breath though improved.  Patient states that he still has some ongoing shortness of breath but feels better than when he was admitted.  He does have a chronic cough, and is coughing in the room this morning.  He has no significant lower extremity edema, JVD, or orthopnea for [place] today.  He was started on Coreg 12.5 mg twice daily, spironolactone 25 daily yesterday.  He is persistently hypertensive though much improved from admission and would likely benefit from [facility] and SGLT GDMT.  Objective:  Vital signs in last 24 hours: Vitals:   03/03/23 2350 03/04/23 0415 03/04/23 0612 03/04/23 0805  BP: (!) 148/91 (!) 148/93  (!) 157/82  Pulse: 82 (!) 56  86  Resp: 18 19  20   Temp: 98 F (36.7 C) 97.9 F (36.6 C)  98.1 F (36.7 C)  TempSrc: Oral Oral  Oral  SpO2: 99% 100%  97%  Weight:   107.3 kg   Height:       Supplemental O2: Room Air SpO2: 97 %   Physical Exam:  Constitutional: in no acute distress Cardiovascular: regular rate and rhythm, no m/r/g. No JVD Pulmonary/Chest: normal work of breathing on room air, lungs clear to auscultation bilaterally. Abdominal: soft, non-tender, non-distended, positive bowel sounds Skin: warm and dry. No LLE Psych: Normal mood and affect  Filed Weights   03/03/23 0838 03/04/23 0612  Weight: 102.1 kg 107.3 kg      Intake/Output Summary (Last 24 hours) at 03/04/2023 0941 Last data filed at 03/04/2023 0759 Gross per 24 hour  Intake 120 ml  Output 2550 ml  Net -2430 ml   Net IO Since Admission: -2,430 mL [03/04/23 0941]  Pertinent Labs:    Latest Ref Rng & Units  03/03/2023    8:49 AM 09/13/2022   11:06 AM 07/28/2020    3:34 AM  CBC  WBC 4.0 - 10.5 K/uL 6.1  7.0  5.0   Hemoglobin 13.0 - 17.0 g/dL 16.1  09.6  04.5   Hematocrit 39.0 - 52.0 % 46.2  49.7  48.0   Platelets 150 - 400 K/uL 217  212  240        Latest Ref Rng & Units 03/04/2023    3:10 AM 03/03/2023    8:49 AM 09/13/2022   11:06 AM  CMP  Glucose 70 - 99 mg/dL 409  811  92   BUN 6 - 20 mg/dL 21  20  26    Creatinine 0.61 - 1.24 mg/dL 9.14  7.82  9.56   Sodium 135 - 145 mmol/L 138  140  141   Potassium 3.5 - 5.1 mmol/L 4.1  4.5  4.0   Chloride 98 - 111 mmol/L 103  105  108   CO2 22 - 32 mmol/L 26  23  22    Calcium 8.9 - 10.3 mg/dL 8.5  8.7  9.1   Total Protein 6.5 - 8.1 g/dL  6.9    Total Bilirubin 0.3 - 1.2 mg/dL  0.8    Alkaline Phos 38 - 126 U/L  61    AST 15 - 41 U/L  30    ALT 0 - 44 U/L  28      Assessment/Plan:   Principal Problem:   Acute exacerbation of CHF (congestive heart failure) (HCC) Active Problems:   Acute on chronic combined systolic and diastolic CHF (congestive heart failure) (HCC)   Patient Summary: Dustin Fowler is a 62 y.o. male with pertinent PMH of CKD 3A, hypertension, hypertrophic cardiomyopathy, mixed systolic and diastolic heart failure, coronary artery disease, and tobacco use disorder who presented with shortness of breath and is admitted for congestive heart failure exacerbation. He is on hospital day 0.  Hypertrophic Cardiomyopathy Mixed Systolic, Diastolic Heart Failure (EF 40-45%) CAD HTN Extensive cardiac history including hypertrophic cardiomyopathy, congestive heart failure, coronary artery disease and longstanding hypertension.  He has been lost to follow-up with cardiology in the past, has struggled with medication adherence.  - Appreciate Cardiology input  -Received 1 dose IV Lasix 40 mg yesterday, with 2.5 L urine output. - ICD discussion per cardiology - Lipid panel LDL of 112 - Atorvastatin 80mg  - Cleda Daub 25mg  Daily -  Entresto 24/26 - Coreg 12.5mg  BID - Echo demonstrated worsening of the patient's cardiac function with reduction of their EF and more areas of hypokinesis.  -Increase importance of GDMT and cardiology follow-up.  -Reaching out to try and find patient assistance programs for GDMT medications.   Tobacco use disorder w/ chronic cough, sputum production  20 pack year history - interested in quitting. Long standing cough w/ sputum production. Not wheezing for [place] on exam today, but was wheezing in ED. S/p 3x Duoneb. 20 pack year history. Some continued cough this AM which I believe likely driving his SOB, given that his volume status seems much better today -  PFT outpatient -  Duoneb PRN wheezing, asked to give one treatment today -  Will d/c with albuterol -  Continue discussion of tobacco cessation.  -  Can consider Chantix at d/c or office f/u. -  Nicotine patch   Alcohol use  Patient drinks about 2 beers per day, sometimes high gravity beers around 5-6% abv. - CIWA protocol   CKD 3a Stable elevated creatinine around 1.6 - 1.7, Compatible with CKD 3a. Favored to be secondary to long standing HTN -Creatinine decreased with diuresis yesterday.   Microcytosis Possible thalassemia given constellation of CBC findings.  - Iron panel was unremarkable - Favored to reflect thalassemia can consider increased work of outpatient but asymptomatic at this time.   Polyuria Obesity Elevated glucose on CMP Routine Health Maintenance - Hba1c pending - HIV pending  Diet: Heart Healthy IVF: None,None VTE: DOAC Code: Full  Dispo: Anticipated discharge to Home in 1 days pending response to the medications, cardiology input.   Dustin Macadamia MD Internal Medicine Resident PGY-1 Pager: [phone] Please contact the on call pager after 5 pm and on weekends at [phone].

## 2023-03-04 NOTE — Discharge Summary (Signed)
by: As directed    Call MD for:  persistant nausea and vomiting   Complete by: As directed    Call MD for:  severe uncontrolled pain   Complete by: As directed    Call MD for:  temperature >100.4   Complete by: As directed    Diet - low sodium heart healthy   Complete by: As directed    Increase activity slowly   Complete by: As directed        SUBJECTIVE:  Please see today's progress note for more information. Improved volume status, Some SOB and cough.    Discharge Vitals:   BP (!) 157/82 (BP Location: Left Arm)   Pulse 86   Temp 98.1 F (36.7 C) (Oral)   Resp 20   Ht 5\' 10"  (1.778 m)   Wt 107.3 kg   SpO2 97%   BMI 33.93 kg/m   OBJECTIVE:   Constitutional: in no acute distress Cardiovascular: regular rate and rhythm, no m/r/g. No JVD Pulmonary/Chest: normal work of breathing on  room air, lungs clear to auscultation bilaterally. Abdominal: soft, non-tender, non-distended, positive bowel sounds Skin: warm and dry. No LLE Psych: Normal mood and affect  Pertinent Labs, Studies, and Procedures:     Latest Ref Rng & Units 03/03/2023    8:49 AM 09/13/2022   11:06 AM 07/28/2020    3:34 AM  CBC  WBC 4.0 - 10.5 K/uL 6.1  7.0  5.0   Hemoglobin 13.0 - 17.0 g/dL 25.3  66.4  40.3   Hematocrit 39.0 - 52.0 % 46.2  49.7  48.0   Platelets 150 - 400 K/uL 217  212  240        Latest Ref Rng & Units 03/04/2023    3:10 AM 03/03/2023    8:49 AM 09/13/2022   11:06 AM  CMP  Glucose 70 - 99 mg/dL 474  259  92   BUN 6 - 20 mg/dL 21  20  26    Creatinine 0.61 - 1.24 mg/dL 5.63  8.75  6.43   Sodium 135 - 145 mmol/L 138  140  141   Potassium 3.5 - 5.1 mmol/L 4.1  4.5  4.0   Chloride 98 - 111 mmol/L 103  105  108   CO2 22 - 32 mmol/L 26  23  22    Calcium 8.9 - 10.3 mg/dL 8.5  8.7  9.1   Total Protein 6.5 - 8.1 g/dL  6.9    Total Bilirubin 0.3 - 1.2 mg/dL  0.8    Alkaline Phos 38 - 126 U/L  61    AST 15 - 41 U/L  30    ALT 0 - 44 U/L  28      ECHOCARDIOGRAM COMPLETE  Result Date: 03/04/2023    ECHOCARDIOGRAM REPORT   Patient Name:   Dustin Fowler Date of Exam: 03/03/2023 Medical Rec #:  [id number]          Height:       70.0 in Accession #:    [id number]         Weight:       225.0 lb Date of Birth:  09-10-1960          BSA:          2.194 m Patient Age:    61 years           BP:           197/96 mmHg Patient Gender: M  Name: Dustin Fowler MRN: [id number] DOB: 1960/09/12 62 y.o. PCP: Marcine Matar, MD  Date of Admission: 03/03/2023  8:31 AM Date of Discharge:  03/04/2023  Attending Physician: Dr.  Lafonda Mosses  DISCHARGE DIAGNOSIS:  Primary Problem: Acute exacerbation of CHF (congestive heart failure) [facility])   Hospital Problems: Principal Problem:   Acute exacerbation of CHF (congestive heart failure) (HCC) Active Problems:   Acute on chronic combined systolic and diastolic CHF (congestive heart failure) (HCC)    DISCHARGE MEDICATIONS:   Allergies as of 03/04/2023   No Known Allergies      Medication List     TAKE these medications    albuterol 108 (90 Base) MCG/ACT inhaler Commonly known as: VENTOLIN HFA Inhale 2 puffs into the lungs every 6 (six) hours as needed for wheezing or shortness of breath.   aspirin EC 81 MG tablet Take 1 tablet (81 mg total) by mouth daily. Swallow whole. Start taking on: March 05, 2023   atorvastatin 80 MG tablet Commonly known as: LIPITOR Take 1 tablet (80 mg total) by mouth daily.   carvedilol 12.5 MG tablet Commonly known as: COREG Take 1 tablet (12.5 mg total) by mouth 2 (two) times daily with a meal.   folic acid 1 MG tablet Commonly known as: FOLVITE Take 1 tablet (1 mg total) by mouth daily. Start taking on: March 05, 2023   furosemide 40 MG tablet Commonly known as: LASIX Take 1 tablet (40 mg total) by mouth once a week. What changed:  when to take this reasons to take this additional instructions   multivitamin with minerals Tabs tablet Take 1 tablet by mouth daily. Start taking on: March 05, 2023   nicotine 14 mg/24hr patch Commonly known as: NICODERM CQ - dosed in mg/24 hours Place 1 patch (14 mg total) onto the skin daily.   nitroGLYCERIN 0.4 MG SL tablet Commonly known as: NITROSTAT Place 1 tablet (0.4 mg total) under the tongue every 5 (five) minutes x 3 doses as needed for chest pain.    sacubitril-valsartan 24-26 MG Commonly known as: ENTRESTO Take 1 tablet by mouth 2 (two) times daily.   spironolactone 25 MG tablet Commonly known as: ALDACTONE Take 1 tablet (25 mg total) by mouth daily. Start taking on: March 05, 2023   thiamine 100 MG tablet Commonly known as: VITAMIN B1 Take 1 tablet (100 mg total) by mouth daily. Start taking on: March 05, 2023        DISPOSITION AND FOLLOW-UP:  Dustin Fowler was discharged from [facility] in Good condition. At the hospital follow up visit please address:  Follow-up Recommendations: Consults: Cardiology Labs: Basic Metabolic Profile Studies: PFT's,  Medications: Chantix, Smoking Cessation   Follow-up Appointments:  Follow-up Information     Lovie Macadamia, MD Follow up on 03/21/2023.   Specialty: Internal Medicine Why: at 8:45 AM, please arrive 15-30 minutes prior to your appointment time Contact information: [address] [place] [address] [phone]         [facility] [facility] Call.   Contact information: [address] [place] [address] [phone]                HOSPITAL COURSE:  Patient Summary: Dustin Fowler is a 62 y.o. male with pertinent PMH of CKD 3A, hypertension, hypertrophic cardiomyopathy, combined systolic and diastolic heart failure, coronary artery disease, and tobacco use disorder who presented with shortness of  Name: Dustin Fowler MRN: [id number] DOB: 1960/09/12 62 y.o. PCP: Marcine Matar, MD  Date of Admission: 03/03/2023  8:31 AM Date of Discharge:  03/04/2023  Attending Physician: Dr.  Lafonda Mosses  DISCHARGE DIAGNOSIS:  Primary Problem: Acute exacerbation of CHF (congestive heart failure) [facility])   Hospital Problems: Principal Problem:   Acute exacerbation of CHF (congestive heart failure) (HCC) Active Problems:   Acute on chronic combined systolic and diastolic CHF (congestive heart failure) (HCC)    DISCHARGE MEDICATIONS:   Allergies as of 03/04/2023   No Known Allergies      Medication List     TAKE these medications    albuterol 108 (90 Base) MCG/ACT inhaler Commonly known as: VENTOLIN HFA Inhale 2 puffs into the lungs every 6 (six) hours as needed for wheezing or shortness of breath.   aspirin EC 81 MG tablet Take 1 tablet (81 mg total) by mouth daily. Swallow whole. Start taking on: March 05, 2023   atorvastatin 80 MG tablet Commonly known as: LIPITOR Take 1 tablet (80 mg total) by mouth daily.   carvedilol 12.5 MG tablet Commonly known as: COREG Take 1 tablet (12.5 mg total) by mouth 2 (two) times daily with a meal.   folic acid 1 MG tablet Commonly known as: FOLVITE Take 1 tablet (1 mg total) by mouth daily. Start taking on: March 05, 2023   furosemide 40 MG tablet Commonly known as: LASIX Take 1 tablet (40 mg total) by mouth once a week. What changed:  when to take this reasons to take this additional instructions   multivitamin with minerals Tabs tablet Take 1 tablet by mouth daily. Start taking on: March 05, 2023   nicotine 14 mg/24hr patch Commonly known as: NICODERM CQ - dosed in mg/24 hours Place 1 patch (14 mg total) onto the skin daily.   nitroGLYCERIN 0.4 MG SL tablet Commonly known as: NITROSTAT Place 1 tablet (0.4 mg total) under the tongue every 5 (five) minutes x 3 doses as needed for chest pain.    sacubitril-valsartan 24-26 MG Commonly known as: ENTRESTO Take 1 tablet by mouth 2 (two) times daily.   spironolactone 25 MG tablet Commonly known as: ALDACTONE Take 1 tablet (25 mg total) by mouth daily. Start taking on: March 05, 2023   thiamine 100 MG tablet Commonly known as: VITAMIN B1 Take 1 tablet (100 mg total) by mouth daily. Start taking on: March 05, 2023        DISPOSITION AND FOLLOW-UP:  Dustin Fowler was discharged from [facility] in Good condition. At the hospital follow up visit please address:  Follow-up Recommendations: Consults: Cardiology Labs: Basic Metabolic Profile Studies: PFT's,  Medications: Chantix, Smoking Cessation   Follow-up Appointments:  Follow-up Information     Lovie Macadamia, MD Follow up on 03/21/2023.   Specialty: Internal Medicine Why: at 8:45 AM, please arrive 15-30 minutes prior to your appointment time Contact information: [address] [place] [address] [phone]         [facility] [facility]. Call.   Contact information: [address] [place] [address] [phone]                HOSPITAL COURSE:  Patient Summary: Dustin Fowler is a 62 y.o. male with pertinent PMH of CKD 3A, hypertension, hypertrophic cardiomyopathy, combined systolic and diastolic heart failure, coronary artery disease, and tobacco use disorder who presented with shortness of  by: As directed    Call MD for:  persistant nausea and vomiting   Complete by: As directed    Call MD for:  severe uncontrolled pain   Complete by: As directed    Call MD for:  temperature >100.4   Complete by: As directed    Diet - low sodium heart healthy   Complete by: As directed    Increase activity slowly   Complete by: As directed        SUBJECTIVE:  Please see today's progress note for more information. Improved volume status, Some SOB and cough.    Discharge Vitals:   BP (!) 157/82 (BP Location: Left Arm)   Pulse 86   Temp 98.1 F (36.7 C) (Oral)   Resp 20   Ht 5\' 10"  (1.778 m)   Wt 107.3 kg   SpO2 97%   BMI 33.93 kg/m   OBJECTIVE:   Constitutional: in no acute distress Cardiovascular: regular rate and rhythm, no m/r/g. No JVD Pulmonary/Chest: normal work of breathing on  room air, lungs clear to auscultation bilaterally. Abdominal: soft, non-tender, non-distended, positive bowel sounds Skin: warm and dry. No LLE Psych: Normal mood and affect  Pertinent Labs, Studies, and Procedures:     Latest Ref Rng & Units 03/03/2023    8:49 AM 09/13/2022   11:06 AM 07/28/2020    3:34 AM  CBC  WBC 4.0 - 10.5 K/uL 6.1  7.0  5.0   Hemoglobin 13.0 - 17.0 g/dL 25.3  66.4  40.3   Hematocrit 39.0 - 52.0 % 46.2  49.7  48.0   Platelets 150 - 400 K/uL 217  212  240        Latest Ref Rng & Units 03/04/2023    3:10 AM 03/03/2023    8:49 AM 09/13/2022   11:06 AM  CMP  Glucose 70 - 99 mg/dL 474  259  92   BUN 6 - 20 mg/dL 21  20  26    Creatinine 0.61 - 1.24 mg/dL 5.63  8.75  6.43   Sodium 135 - 145 mmol/L 138  140  141   Potassium 3.5 - 5.1 mmol/L 4.1  4.5  4.0   Chloride 98 - 111 mmol/L 103  105  108   CO2 22 - 32 mmol/L 26  23  22    Calcium 8.9 - 10.3 mg/dL 8.5  8.7  9.1   Total Protein 6.5 - 8.1 g/dL  6.9    Total Bilirubin 0.3 - 1.2 mg/dL  0.8    Alkaline Phos 38 - 126 U/L  61    AST 15 - 41 U/L  30    ALT 0 - 44 U/L  28      ECHOCARDIOGRAM COMPLETE  Result Date: 03/04/2023    ECHOCARDIOGRAM REPORT   Patient Name:   Dustin Fowler Date of Exam: 03/03/2023 Medical Rec #:  [id number]          Height:       70.0 in Accession #:    [id number]         Weight:       225.0 lb Date of Birth:  09-10-1960          BSA:          2.194 m Patient Age:    61 years           BP:           197/96 mmHg Patient Gender: M  by: As directed    Call MD for:  persistant nausea and vomiting   Complete by: As directed    Call MD for:  severe uncontrolled pain   Complete by: As directed    Call MD for:  temperature >100.4   Complete by: As directed    Diet - low sodium heart healthy   Complete by: As directed    Increase activity slowly   Complete by: As directed        SUBJECTIVE:  Please see today's progress note for more information. Improved volume status, Some SOB and cough.    Discharge Vitals:   BP (!) 157/82 (BP Location: Left Arm)   Pulse 86   Temp 98.1 F (36.7 C) (Oral)   Resp 20   Ht 5\' 10"  (1.778 m)   Wt 107.3 kg   SpO2 97%   BMI 33.93 kg/m   OBJECTIVE:   Constitutional: in no acute distress Cardiovascular: regular rate and rhythm, no m/r/g. No JVD Pulmonary/Chest: normal work of breathing on  room air, lungs clear to auscultation bilaterally. Abdominal: soft, non-tender, non-distended, positive bowel sounds Skin: warm and dry. No LLE Psych: Normal mood and affect  Pertinent Labs, Studies, and Procedures:     Latest Ref Rng & Units 03/03/2023    8:49 AM 09/13/2022   11:06 AM 07/28/2020    3:34 AM  CBC  WBC 4.0 - 10.5 K/uL 6.1  7.0  5.0   Hemoglobin 13.0 - 17.0 g/dL 25.3  66.4  40.3   Hematocrit 39.0 - 52.0 % 46.2  49.7  48.0   Platelets 150 - 400 K/uL 217  212  240        Latest Ref Rng & Units 03/04/2023    3:10 AM 03/03/2023    8:49 AM 09/13/2022   11:06 AM  CMP  Glucose 70 - 99 mg/dL 474  259  92   BUN 6 - 20 mg/dL 21  20  26    Creatinine 0.61 - 1.24 mg/dL 5.63  8.75  6.43   Sodium 135 - 145 mmol/L 138  140  141   Potassium 3.5 - 5.1 mmol/L 4.1  4.5  4.0   Chloride 98 - 111 mmol/L 103  105  108   CO2 22 - 32 mmol/L 26  23  22    Calcium 8.9 - 10.3 mg/dL 8.5  8.7  9.1   Total Protein 6.5 - 8.1 g/dL  6.9    Total Bilirubin 0.3 - 1.2 mg/dL  0.8    Alkaline Phos 38 - 126 U/L  61    AST 15 - 41 U/L  30    ALT 0 - 44 U/L  28      ECHOCARDIOGRAM COMPLETE  Result Date: 03/04/2023    ECHOCARDIOGRAM REPORT   Patient Name:   Dustin Fowler Date of Exam: 03/03/2023 Medical Rec #:  [id number]          Height:       70.0 in Accession #:    [id number]         Weight:       225.0 lb Date of Birth:  09-10-1960          BSA:          2.194 m Patient Age:    61 years           BP:           197/96 mmHg Patient Gender: M

## 2023-03-04 NOTE — Discharge Instructions (Addendum)
[name],  You were recently admitted to [facility] for heart failure.  Since you have been out of your medications for a while your heart failure has gotten slightly worse.  It is very important that we get you back on medication and you follow-up with [place] in the clinic and your cardiologist.  It is also important with your type of heart failure that you be very careful to avoid dehydration.  Continue taking your home medications with the following changes  Start taking Atorvastatin 80 mg daily Carvedilol 12.5 mg twice daily Spironolactone 25 mg daily Aspirin 81 mg daily Entresto 24-26 mg twice daily Furosemide 40 mg weekly (every Monday) Folic acid 1 mg daily Thiamine 100 mg daily Multivitamin daily Nicotine patch daily Nitroglycerin 0.4 mg sublingual every 5 minutes as needed for chest pain.  If you use this medicine call an ambulance or come to the emergency department   You should seek further medical care if you have significant trouble breathing, chest pain, worsening cough, or other worsening symptoms.  We have made an appointment for you on 03/21/2023 at 8:45 AM with Dr. Rosaura Carpenter at [facility].  Please also follow-up with cardiology.  If you have any difficulty with this we will help you when you follow-up in our clinic.  Sincerely, Rocky Morel, DO

## 2023-03-06 ENCOUNTER — Telehealth: Payer: Self-pay

## 2023-03-06 LAB — HEMOGLOBIN A1C
Hgb A1c MFr Bld: 6.1 % — ABNORMAL HIGH (ref 4.8–5.6)
Mean Plasma Glucose: 128 mg/dL

## 2023-03-06 NOTE — Transitions of Care (Post Inpatient/ED Visit) (Signed)
03/06/2023  Name: Dustin Fowler MRN: [id number] DOB: 1960/06/26  Today's TOC FU Call Status: Today's TOC FU Call Status:: Successful TOC FU Call Completed TOC FU Call Complete Date: 03/06/23 Patient's Name and Date of Birth confirmed.  Transition Care Management Follow-up Telephone Call Date of Discharge: 03/04/23 Discharge Facility: [facility]) Type of Discharge: Inpatient Admission Primary Inpatient Discharge Diagnosis:: acute exacerbation of CHF How have you been since you were released from the hospital?: Better Any questions or concerns?: No  Items Reviewed: Did you receive and understand the discharge instructions provided?: Yes Medications obtained,verified, and reconciled?: Partial Review Completed Reason for Partial Mediation Review: He said he has all medications except the multivitamins and he will need to get those OTC.  he did not have any questions about the med regime and did not need to review the med list. Any new allergies since your discharge?: No Dietary orders reviewed?: Yes Type of Diet Ordered:: heart healthy, low sodium Do you have support at home?: Yes People in Home: alone Name of Support/Comfort Primary Source: he can contact his sister for assistance if needed  Medications Reviewed Today: Medications Reviewed Today   Medications were not reviewed in this encounter     Home Care and Equipment/Supplies: Were Home Health Services Ordered?: No Any new equipment or medical supplies ordered?: No  Functional Questionnaire: Do you need assistance with bathing/showering or dressing?: No Do you need assistance with meal preparation?: No Do you need assistance with eating?: No Do you have difficulty maintaining continence: No Do you need assistance with getting out of bed/getting out of a chair/moving?: No Do you have difficulty managing or taking your medications?: No  Follow up appointments reviewed: PCP Follow-up appointment confirmed?:  Yes Date of PCP follow-up appointment?: 03/21/23 Follow-up Provider: [facility].  he will establish care there with PCP. Specialist Hospital Follow-up appointment confirmed?: No Reason Specialist Follow-Up Not Confirmed:  (patient will discuss follow up with cardiology when he sees his new pcp) Do you need transportation to your follow-up appointment?: No (he is planning to take the bus.  I told him to call [facility] if he is not able to take the bus and request cab transportation to his appointment as we do not want him to miss the appointment) Do you understand care options if your condition(s) worsen?: Yes-patient verbalized understanding    SIGNATURE Robyne Peers, RN

## 2023-03-21 ENCOUNTER — Ambulatory Visit (INDEPENDENT_AMBULATORY_CARE_PROVIDER_SITE_OTHER): Payer: 59 | Admitting: Student

## 2023-03-21 ENCOUNTER — Encounter: Payer: Self-pay | Admitting: Student

## 2023-03-21 VITALS — BP 166/101 | HR 66 | Temp 98.2°F | Ht 70.0 in | Wt 245.3 lb

## 2023-03-21 DIAGNOSIS — I110 Hypertensive heart disease with heart failure: Secondary | ICD-10-CM

## 2023-03-21 DIAGNOSIS — Z720 Tobacco use: Secondary | ICD-10-CM

## 2023-03-21 DIAGNOSIS — F17210 Nicotine dependence, cigarettes, uncomplicated: Secondary | ICD-10-CM | POA: Diagnosis not present

## 2023-03-21 DIAGNOSIS — Z23 Encounter for immunization: Secondary | ICD-10-CM

## 2023-03-21 DIAGNOSIS — I5042 Chronic combined systolic (congestive) and diastolic (congestive) heart failure: Secondary | ICD-10-CM

## 2023-03-21 DIAGNOSIS — I10 Essential (primary) hypertension: Secondary | ICD-10-CM

## 2023-03-21 DIAGNOSIS — R053 Chronic cough: Secondary | ICD-10-CM

## 2023-03-21 DIAGNOSIS — I25118 Atherosclerotic heart disease of native coronary artery with other forms of angina pectoris: Secondary | ICD-10-CM | POA: Diagnosis not present

## 2023-03-21 MED ORDER — FUROSEMIDE 40 MG PO TABS
40.0000 mg | ORAL_TABLET | ORAL | 1 refills | Status: DC
Start: 2023-03-21 — End: 2023-05-05

## 2023-03-21 MED ORDER — VARENICLINE TARTRATE 1 MG PO TABS
1.0000 mg | ORAL_TABLET | Freq: Two times a day (BID) | ORAL | 0 refills | Status: DC
Start: 2023-03-21 — End: 2023-12-18

## 2023-03-21 MED ORDER — VARENICLINE TARTRATE 0.5 MG PO TABS
ORAL_TABLET | ORAL | 0 refills | Status: DC
Start: 2023-03-21 — End: 2023-12-18

## 2023-03-21 MED ORDER — ATORVASTATIN CALCIUM 80 MG PO TABS
80.0000 mg | ORAL_TABLET | Freq: Every day | ORAL | 3 refills | Status: DC
Start: 2023-03-21 — End: 2023-05-05

## 2023-03-21 MED ORDER — EMPAGLIFLOZIN 10 MG PO TABS: 10.0000 mg | ORAL_TABLET | Freq: Every day | ORAL | 1 refills | Status: DC

## 2023-03-21 NOTE — Assessment & Plan Note (Signed)
Patient continues to have chronic cough which she was given an albuterol inhaler for at last admission.  We discussed the fact that he may have COPD given his tobacco history as well as his chronic cough, but formal PFTs would be required in order to diagnose him.  Patient is interested in getting PFTs done in order to further workup his chronic cough. Plan: Tobacco cessation discussed, Chantix prescribed Referral for pulmonary function testing

## 2023-03-21 NOTE — Progress Notes (Signed)
Subjective:  CC: Hospital follow-up, congestive heart failure hypertrophic cardiomyopathy, mixed systolic diastolic heart failure.  HPI:  Dustin Fowler is a 62 y.o. person with a past medical history stated below and presents today for hospital follow-up. Please see problem based assessment and plan for additional details.  Past Medical History:  Diagnosis Date   CAD (coronary artery disease)    CHF (congestive heart failure) (HCC)    Hypertension    Prediabetes     Current Outpatient Medications on File Prior to Visit  Medication Sig Dispense Refill   albuterol (VENTOLIN HFA) 108 (90 Base) MCG/ACT inhaler Inhale 2 puffs into the lungs every 6 (six) hours as needed for wheezing or shortness of breath. 6.7 g 2   aspirin EC 81 MG tablet Take 1 tablet (81 mg total) by mouth daily. Swallow whole. 30 tablet 12   carvedilol (COREG) 12.5 MG tablet Take 1 tablet (12.5 mg total) by mouth 2 (two) times daily with a meal. 60 tablet 6   nitroGLYCERIN (NITROSTAT) 0.4 MG SL tablet Place 1 tablet (0.4 mg total) under the tongue every 5 (five) minutes x 3 doses as needed for chest pain. 25 tablet 6   sacubitril-valsartan (ENTRESTO) 24-26 MG Take 1 tablet by mouth 2 (two) times daily. 60 tablet 0   spironolactone (ALDACTONE) 25 MG tablet Take 1 tablet (25 mg total) by mouth daily. 30 tablet 2   No current facility-administered medications on file prior to visit.    Review of Systems: Please see assessment and plan for pertinent positives and negatives.  Objective:   Vitals:   03/21/23 0828 03/21/23 0904  BP: (!) 177/94 (!) 166/101  Pulse: 75 66  Temp: 98.2 F (36.8 C)   TempSrc: Oral   SpO2: 97%   Weight: 245 lb 4.8 oz (111.3 kg)   Height: 5\' 10"  (1.778 m)     Physical Exam: Constitutional: Well-appearing, in no acute distress Cardiovascular: regular rate and rhythm, no m/r/g.  Pulmonary/Chest: normal work of breathing on room air, lungs clear to auscultation bilaterally  no crackles appreciated Abdominal: soft, non-tender, non-distended Extremities: No edema of the lower extremities bilaterally Skin: warm and dry Psych: Pleasant mood and affect   Assessment & Plan:  Chronic combined systolic and diastolic heart failure [facility]) Dustin Fowler is a gentleman with mixed systolic and diastolic heart failure.  Last ejection fraction 30 to 35%, with significant amount of ventricular hypertrophy.  He meets criteria for diagnosis with hypertrophic cardiomyopathy.  He has not followed up with cardiology since we seen him recently in the hospital for heart failure exacerbation. He has been compliant with all his medications, blood pressure persistent elevated 166/101.  Seems like there was some issue with getting him his TOC medications at discharge as he did not have Dustin Fowler or Imdur.   We discussed his diagnosis, and what having heart failure means.  We also discussed the fact that he should continue to follow with cardiology and would benefit benefit from ICD.  He is hesitant to have this procedure and was wondering if medical management of his heart failure would negate the need of a ICD.  We discussed the indications for ICD failure and why he will need both medical management as well as an ICD.  He has gained about 10 pounds since hospital discharge, but he is otherwise euvolemic on exam and denies shortness of breath orthopnea or exertional dyspnea.  He feels that he has been doing well since discharge.  We  discussed getting a scale so we may begin tracking his weight moving forward to get a better understanding of how much he should weigh. Plan: Added Dustin Fowler 10 mg daily for GDMT Discussed that this may be expensive and to let us know if he has any troubles with this medication. Refill Lipitor, furosemide BMP today to assess for electrolyte abnormalities, kidney dysfunction in setting of new medications. Referral to social work for possible medication assistance  programs. He is currently on medications he was discharged from hospital with, and has not had to pay for any of his prescriptions yet.  It is possible that some of his medications such as Dustin Fowler and Sherryll Burger may be expensive.  I urged the patient to reach out to our clinic if he has any difficulty paying for this medication so we may work on alternatives and troubleshoot alongside him. Urged patient to follow-up with cardiology.   Coronary artery disease of native artery of native heart with stable angina pectoris (HCC) History of coronary artery disease with recent echo showing worsening of his heart failure.  He was seen by cardiology in hospital and was recommended to follow-up with them outpatient.  I have provided the patient with cardiology's phone number and urged him to follow-up with them as well as [place]. Plan: Follow-up with cardiology Continue aspirin 81 mg daily   Essential hypertension Patient with a history of significant hypertension, likely leading to his kidney disease as well as hypertrophic heart disease.  His blood pressure today is still elevated at 166/101.  Will follow-up closely with the patient as we check labs and titrate medications in order to get him on a stable GDMT regiment. Plan: Begin Dustin Fowler 10 mg daily BMP today to assess for electrolyte abnormality, kidney dysfunction in setting of new medications. I will follow-up with the patient following his BMP results in order to uptitrate some of his medications, he will follow-up with [place] in a few weeks in order to continue to check in on his blood pressure. Will increase Entresto dosage if BMP unremarkable, has room to go up on mineralocorticoid receptor antagonist.  Heart rate today in the 60s, would prefer not to increase Coreg dosing.   Tobacco abuse Patient has a history of tobacco abuse.  Likely contributory to his known coronary artery disease.  He says that he has not been using the nicotine patches as he  read that it may increase his blood pressure, which is already high.  We discussed tobacco cessation and the patient says he desperately would like to quit.  He is feels that his cigarette use is affecting his health - specifically his heart and lungs. Plan: Prescribe Chantix ramp and maintenance therapy. Discussed with patient that he should pick up 0.5 mg Chantix ramp-up dosing first and see if he has any side effects before paying for and picking up the Chantix 1 mg maintenance dose. Discussed beginning about 1 week before quit date and possible side effects with the patient.   Chronic cough Patient continues to have chronic cough which she was given an albuterol inhaler for at last admission.  We discussed the fact that he may have COPD given his tobacco history as well as his chronic cough, but formal PFTs would be required in order to diagnose him.  Patient is interested in getting PFTs done in order to further workup his chronic cough. Plan: Tobacco cessation discussed, Chantix prescribed Referral for pulmonary function testing   Encounter for immunization Flu vaccine given today in  office    Patient seen with Dr. Elliot Cousin MD [facility]  PGY-1 Pager: [phone]  Phone: [phone] Date 03/21/2023  Time 1:17 PM

## 2023-03-21 NOTE — Addendum Note (Signed)
Addended by: Lovie Macadamia on: 03/21/2023 02:27 PM   Modules accepted: Orders

## 2023-03-21 NOTE — Assessment & Plan Note (Addendum)
Patient with a history of significant hypertension, likely leading to his kidney disease as well as hypertrophic heart disease.  His blood pressure today is still elevated at 166/101.  Will follow-up closely with the patient as we check labs and titrate medications in order to get him on a stable GDMT regiment. Plan: Begin Jardiance 10 mg daily BMP today to assess for electrolyte abnormality, kidney dysfunction in setting of new medications. I will follow-up with the patient following his BMP results in order to uptitrate some of his medications, he will follow-up with [place] in a few weeks in order to continue to check in on his blood pressure. Will increase Entresto dosage if BMP unremarkable, has room to go up on mineralocorticoid receptor antagonist.  Heart rate today in the 60s, would prefer not to increase Coreg dosing.

## 2023-03-21 NOTE — Assessment & Plan Note (Signed)
Flu vaccine given today in office

## 2023-03-21 NOTE — Assessment & Plan Note (Signed)
Patient has a history of tobacco abuse.  Likely contributory to his known coronary artery disease.  He says that he has not been using the nicotine patches as he read that it may increase his blood pressure, which is already high.  We discussed tobacco cessation and the patient says he desperately would like to quit.  He is feels that his cigarette use is affecting his health - specifically his heart and lungs. Plan: Prescribe Chantix ramp and maintenance therapy. Discussed with patient that he should pick up 0.5 mg Chantix ramp-up dosing first and see if he has any side effects before paying for and picking up the Chantix 1 mg maintenance dose. Discussed beginning about 1 week before quit date and possible side effects with the patient.

## 2023-03-21 NOTE — Assessment & Plan Note (Addendum)
Dustin Fowler is a gentleman with mixed systolic and diastolic heart failure.  Last ejection fraction 30 to 35%, with significant amount of ventricular hypertrophy.  He meets criteria for diagnosis with hypertrophic cardiomyopathy.  He has not followed up with cardiology since we seen him recently in the hospital for heart failure exacerbation. He has been compliant with all his medications, blood pressure persistent elevated 166/101.  Seems like there was some issue with getting him his TOC medications at discharge as he did not have Jardiance or Imdur.   We discussed his diagnosis, and what having heart failure means.  We also discussed the fact that he should continue to follow with cardiology and would benefit benefit from ICD.  He is hesitant to have this procedure and was wondering if medical management of his heart failure would negate the need of a ICD.  We discussed the indications for ICD failure and why he will need both medical management as well as an ICD.  He has gained about 10 pounds since hospital discharge, but he is otherwise euvolemic on exam and denies shortness of breath orthopnea or exertional dyspnea.  He feels that he has been doing well since discharge.  We discussed getting a scale so we may begin tracking his weight moving forward to get a better understanding of how much he should weigh. Plan: Added Jardiance 10 mg daily for GDMT Discussed that this may be expensive and to let us know if he has any troubles with this medication. Refill Lipitor, furosemide BMP today to assess for electrolyte abnormalities, kidney dysfunction in setting of new medications. Referral to social work for possible medication assistance programs. He is currently on medications he was discharged from hospital with, and has not had to pay for any of his prescriptions yet.  It is possible that some of his medications such as Jardiance and Sherryll Burger may be expensive.  I urged the patient to reach out to our  clinic if he has any difficulty paying for this medication so we may work on alternatives and troubleshoot alongside him. Urged patient to follow-up with cardiology.

## 2023-03-21 NOTE — Assessment & Plan Note (Signed)
History of coronary artery disease with recent echo showing worsening of his heart failure.  He was seen by cardiology in hospital and was recommended to follow-up with them outpatient.  I have provided the patient with cardiology's phone number and urged him to follow-up with them as well as [place]. Plan: Follow-up with cardiology Continue aspirin 81 mg daily

## 2023-03-21 NOTE — Patient Instructions (Addendum)
Thank you, [name] for allowing [place] to provide your care today.  I have ordered the following tests for you:  Lab Orders         BMP8+Anion Gap      Referrals ordered today:   PLEASE CALL:   Jonah Blue, MD [phone]    I have ordered the following medication/changed the following medications:   Stop the following medications: Medications Discontinued During This Encounter  Medication Reason   Multiple Vitamin (MULTIVITAMIN WITH MINERALS) TABS tablet    folic acid (FOLVITE) 1 MG tablet    thiamine (VITAMIN B1) 100 MG tablet    nicotine (NICODERM CQ - DOSED IN MG/24 HOURS) 14 mg/24hr patch    atorvastatin (LIPITOR) 80 MG tablet Reorder   furosemide (LASIX) 40 MG tablet Reorder     Start the following medications: Meds ordered this encounter  Medications   furosemide (LASIX) 40 MG tablet    Sig: Take 1 tablet (40 mg total) by mouth once a week.    Dispense:  16 tablet    Refill:  1   atorvastatin (LIPITOR) 80 MG tablet    Sig: Take 1 tablet (80 mg total) by mouth daily.    Dispense:  90 tablet    Refill:  3   varenicline (CHANTIX) 0.5 MG tablet    Sig: Take one 0.5-milligram tablet once a day for 3 days, then increase to one 0.5-milligram tablet twice a day for 4 days. The dose is slowly increased to lessen the chance of side effects (such as nausea, unusual dreams). Begin taking varenicline as directed 1 to 2 weeks before the quit date.    Dispense:  11 tablet    Refill:  0   varenicline (CHANTIX CONTINUING MONTH PAK) 1 MG tablet    Sig: Take 1 tablet (1 mg total) by mouth 2 (two) times daily.    Dispense:  168 tablet    Refill:  0   empagliflozin (JARDIANCE) 10 MG TABS tablet    Sig: Take 1 tablet (10 mg total) by mouth daily before breakfast.    Dispense:  30 tablet    Refill:  1   Lasix, and Lipitor you are already on Chantix is for smoking. You can pick up the 0.5mg  first and take them to see if you have side effects, then if you do not  have side effects you will pick up the 1mg . No need to pay for the 1mg  up front if you may end of not taking them due to side effects Jardiance is a new medication that helps with heart, sugar, and blood pressure. It may be expensive so let us know.    Follow up:  2-4 Weeks  for Repeat blood pressure / Diuretic titration   Please remember: Please buy a home scale so we may track your weight and get an understanding of how much water your body may be holding on to.    We look forward to seeing you next time. Please call our clinic at [phone] if you have any questions or concerns. The best time to call is Monday-Friday from 9am-4pm, but there is someone available 24/7. If after hours or the weekend, call the main hospital number and ask for the Internal Medicine Resident On-Call. If you need medication refills, please notify your pharmacy one week in advance and they will send [place] a request.   Thank you for trusting me with your care. Wishing you the best!  Lovie Macadamia MD  [facility]

## 2023-03-22 ENCOUNTER — Telehealth: Payer: Self-pay | Admitting: Student

## 2023-03-22 LAB — BMP8+ANION GAP
Anion Gap: 14 mmol/L (ref 10.0–18.0)
BUN/Creatinine Ratio: 13 (ref 9–20)
BUN: 18 mg/dL (ref 6–24)
CO2: 24 mmol/L (ref 20–29)
Calcium: 9.1 mg/dL (ref 8.7–10.2)
Chloride: 103 mmol/L (ref 96–106)
Creatinine, Ser: 1.36 mg/dL — ABNORMAL HIGH (ref 0.76–1.27)
Glucose: 95 mg/dL (ref 70–99)
Potassium: 5.2 mmol/L (ref 3.5–5.2)
Sodium: 141 mmol/L (ref 134–144)
eGFR: 60 mL/min/{1.73_m2} (ref >59)

## 2023-03-22 MED ORDER — ENTRESTO 49-51 MG PO TABS: 1.0000 | ORAL_TABLET | Freq: Two times a day (BID) | ORAL | 0 refills | Status: DC

## 2023-03-22 NOTE — Telephone Encounter (Signed)
I spoke with Dustin Fowler on the phone. Patient's identity was confirmed using two patient specific identifiers. We discussed his lab work. K at high end of normal.  We will go up on his Entresto ideally for GDMT purposes today.  We will need to monitor his potassium closely as we uptitrate this medication.  Patient states he has not been able to go pick up his Jardiance yet.  I urged the patient to please follow-up with cardiology, and let us know if he has any issues with this medication.  I have asked the front desk to reach out to him and schedule him for a follow-up appointment with me as he does not have one on the books currently.  Again stressed importance of staying on GDMT with this patient with severe hypertensive heart disease, systolic and diastolic dysfunction.

## 2023-03-23 NOTE — Progress Notes (Signed)
Internal Medicine Clinic Attending  I was physically present during the key portions of the resident provided service and participated in the medical decision making of patient's management care. I reviewed pertinent patient test results.  The assessment, diagnosis, and plan were formulated together and I agree with the documentation in the resident's note.  Mercie Eon, MD   I know this patient well from his recent hospitalization.  He feels well, with no dyspnea or orthopnea, but he has gained 10 lbs since hospital discharge. He does not have a scale at home, so the standing weight in clinic with his clothes & shoes on is not 100% accurate, but I do still think he's gaining weight.   After reviewing labs & Dr Lenoria Farrier phone note, I agree with his plan to: Start Jardiance 10mg  daily Increase Entresto with repeat BMP in 1-2 weeks Cardiology follow up (patient has number, he needs to call) Patient to purchase a scale and check his weights daily. Keep lasix dose the same for now, but once he's checking daily weights, I will likely increase lasix dosing to twice weekly

## 2023-04-12 ENCOUNTER — Other Ambulatory Visit: Payer: Self-pay

## 2023-04-14 ENCOUNTER — Other Ambulatory Visit (HOSPITAL_COMMUNITY): Payer: Self-pay

## 2023-04-14 ENCOUNTER — Other Ambulatory Visit: Payer: Self-pay

## 2023-04-18 ENCOUNTER — Encounter: Payer: 59 | Admitting: Student

## 2023-04-20 ENCOUNTER — Ambulatory Visit: Payer: 59 | Admitting: Internal Medicine

## 2023-05-05 ENCOUNTER — Encounter: Payer: Self-pay | Admitting: Student

## 2023-05-05 ENCOUNTER — Ambulatory Visit (INDEPENDENT_AMBULATORY_CARE_PROVIDER_SITE_OTHER): Payer: 59 | Admitting: Student

## 2023-05-05 VITALS — BP 184/104 | HR 68 | Ht 70.0 in | Wt 244.1 lb

## 2023-05-05 DIAGNOSIS — I110 Hypertensive heart disease with heart failure: Secondary | ICD-10-CM | POA: Diagnosis not present

## 2023-05-05 DIAGNOSIS — I25118 Atherosclerotic heart disease of native coronary artery with other forms of angina pectoris: Secondary | ICD-10-CM

## 2023-05-05 DIAGNOSIS — I10 Essential (primary) hypertension: Secondary | ICD-10-CM

## 2023-05-05 DIAGNOSIS — I5043 Acute on chronic combined systolic (congestive) and diastolic (congestive) heart failure: Secondary | ICD-10-CM | POA: Diagnosis not present

## 2023-05-05 DIAGNOSIS — I5042 Chronic combined systolic (congestive) and diastolic (congestive) heart failure: Secondary | ICD-10-CM

## 2023-05-05 MED ORDER — ATORVASTATIN CALCIUM 80 MG PO TABS
80.0000 mg | ORAL_TABLET | Freq: Every day | ORAL | 3 refills | Status: DC
Start: 2023-05-05 — End: 2023-12-18

## 2023-05-05 MED ORDER — SPIRONOLACTONE 25 MG PO TABS
25.0000 mg | ORAL_TABLET | Freq: Every day | ORAL | 2 refills | Status: DC
Start: 2023-05-05 — End: 2023-08-28

## 2023-05-05 MED ORDER — FUROSEMIDE 40 MG PO TABS: 40.0000 mg | ORAL_TABLET | ORAL | 1 refills | Status: DC

## 2023-05-05 MED ORDER — ASPIRIN 81 MG PO TBEC: 81.0000 mg | DELAYED_RELEASE_TABLET | Freq: Every day | ORAL | 12 refills | Status: AC

## 2023-05-05 MED ORDER — CARVEDILOL 12.5 MG PO TABS: 12.5000 mg | ORAL_TABLET | Freq: Two times a day (BID) | ORAL | 6 refills | Status: DC

## 2023-05-05 MED ORDER — ENTRESTO 49-51 MG PO TABS: 1.0000 | ORAL_TABLET | Freq: Two times a day (BID) | ORAL | 0 refills | Status: DC

## 2023-05-05 MED ORDER — EMPAGLIFLOZIN 10 MG PO TABS: 10.0000 mg | ORAL_TABLET | Freq: Every day | ORAL | 1 refills | Status: AC

## 2023-05-05 NOTE — Assessment & Plan Note (Addendum)
Patient last seen in the clinic on 03/21/2023 for hospital follow-up.  Today, patient notes he has had difficulty with taking his medications and managing his follow-up with cardiology.  His blood pressure is elevated today at 184/104.  Patient is unsure what medications he has been taking but notes he has been taking 3 at home.  There is concern on his last visit for persistent weight gain, but his weight is stable today.  He has been taking his Lasix once per week and is euvolemic on exam today.  Patient has also not been measuring his blood pressure at home so he was encouraged to begin doing this.  Patient is overwhelmed with his concomitant diagnoses. We will resend in his prescriptions and have them pick these up.  Will follow-up with the patient 1 week to confirm what medications he has been taking.  Patient will also follow-up with cardiology for further workup regarding his other comorbidities.  Patient was encouraged to bring his pill bottles to [facility] if he does not get duplicates.  We will not change any medications for the patient at this time as he is unsure what he is taking. - Follow-up in 1 week to confirm medications patient is taking - Cardiology referral - Re-send medications for patient to pick up

## 2023-05-05 NOTE — Progress Notes (Signed)
CC: Heart failure hypertension follow-up  HPI: [name] is a 62 y.o. male living with a history stated below and presents today for heart failure and hypertension follow-up. Please see problem based assessment and plan for additional details.  Past Medical History:  Diagnosis Date   CAD (coronary artery disease)    CHF (congestive heart failure) (HCC)    Hypertension    Prediabetes     Current Outpatient Medications on File Prior to Visit  Medication Sig Dispense Refill   albuterol (VENTOLIN HFA) 108 (90 Base) MCG/ACT inhaler Inhale 2 puffs into the lungs every 6 (six) hours as needed for wheezing or shortness of breath. 6.7 g 2   nitroGLYCERIN (NITROSTAT) 0.4 MG SL tablet Place 1 tablet (0.4 mg total) under the tongue every 5 (five) minutes x 3 doses as needed for chest pain. 25 tablet 6   varenicline (CHANTIX CONTINUING MONTH PAK) 1 MG tablet Take 1 tablet (1 mg total) by mouth 2 (two) times daily. 168 tablet 0   varenicline (CHANTIX) 0.5 MG tablet Take one 0.5-milligram tablet once a day for 3 days, then increase to one 0.5-milligram tablet twice a day for 4 days. The dose is slowly increased to lessen the chance of side effects (such as nausea, unusual dreams). Begin taking varenicline as directed 1 to 2 weeks before the quit date. 11 tablet 0   No current facility-administered medications on file prior to visit.    No family history on file.  Social History   Socioeconomic History   Marital status: Single    Spouse name: Not on file   Number of children: Not on file   Years of education: Not on file   Highest education level: Not on file  Occupational History   Not on file  Tobacco Use   Smoking status: Every Day    Current packs/day: 0.00    Types: Cigarettes    Start date: 07/24/1980    Last attempt to quit: 07/24/2020    Years since quitting: 2.7   Smokeless tobacco: Never   Tobacco comments:    willing to stop.   Vaping Use   Vaping status: Never  Used  Substance and Sexual Activity   Alcohol use: Yes    Alcohol/week: 4.0 standard drinks of alcohol    Types: 4 Cans of beer per week    Comment: 1 40oz can a day, everyday.   Drug use: Not Currently   Sexual activity: Not on file  Other Topics Concern   Not on file  Social History Narrative   ** Merged History Encounter **       Social Determinants of Health   Financial Resource Strain: High Risk (07/27/2020)   Overall Financial Resource Strain (CARDIA)    Difficulty of Paying Living Expenses: Hard  Food Insecurity: No Food Insecurity (03/03/2023)   Hunger Vital Sign    Worried About Running Out of Food in the Last Year: Never true    Ran Out of Food in the Last Year: Never true  Transportation Needs: No Transportation Needs (03/03/2023)   PRAPARE - Administrator, Civil Service (Medical): No    Lack of Transportation (Non-Medical): No  Physical Activity: Insufficiently Active (07/27/2020)   Exercise Vital Sign    Days of Exercise per Week: 4 days    Minutes of Exercise per Session: 30 min  Stress: Not on file  Social Connections: Not on file  Intimate Partner Violence: Not At Risk (03/03/2023)  Humiliation, Afraid, Rape, and Kick questionnaire    Fear of Current or Ex-Partner: No    Emotionally Abused: No    Physically Abused: No    Sexually Abused: No    Review of Systems: ROS negative except for what is noted on the assessment and plan.  Vitals:   05/05/23 0942  BP: (!) 184/104  Pulse: 68  SpO2: 99%  Weight: 244 lb 1.6 oz (110.7 kg)  Height: 5\' 10"  (1.778 m)    Physical Exam: Constitutional: well-appearing in no acute distress HENT: normocephalic atraumatic, mucous membranes moist Eyes: conjunctiva non-erythematous Neck: supple Cardiovascular: regular rate and rhythm, no m/r/g Pulmonary/Chest: normal work of breathing on room air, lungs clear to auscultation bilaterally Abdominal: soft, non-tender, non-distended MSK: normal bulk and  tone Neurological: alert & oriented x 3, 5/5 strength in bilateral upper and lower extremities, normal gait Skin: warm and dry  Assessment & Plan:   Acute on chronic combined systolic and diastolic CHF (congestive heart failure) (HCC) Patient last seen in the clinic on 03/21/2023 for hospital follow-up.  Today, patient notes he has had difficulty with taking his medications and managing his follow-up with cardiology.  His blood pressure is elevated today at 184/104.  Patient is unsure what medications he has been taking but notes he has been taking 3 at home.  There is concern on his last visit for persistent weight gain, but his weight is stable today.  He has been taking his Lasix once per week and is euvolemic on exam today.  Patient has also not been measuring his blood pressure at home so he was encouraged to begin doing this.  Patient is overwhelmed with his concomitant diagnoses. We will resend in his prescriptions and have them pick these up.  Will follow-up with the patient 1 week to confirm what medications he has been taking.  Patient will also follow-up with cardiology for further workup regarding his other comorbidities.  Patient was encouraged to bring his pill bottles to [facility] if he does not get duplicates.  We will not change any medications for the patient at this time as he is unsure what he is taking. - Follow-up in 1 week to confirm medications patient is taking - Cardiology referral - Re-send medications for patient to pick up  Patient seen with Dr. Susa Raring, MD  [facility] Internal Medicine, PGY-1 Date 05/05/2023 Time 2:19 PM

## 2023-05-05 NOTE — Patient Instructions (Signed)
Thank you so much for coming to the clinic today!   I have sent your medications to [facility]. When you pick them up, please bring your pill bottles that you have at home.  I have also made a referral to the heart doctors, and you should be receiving a call to schedule this soon.   If you have any questions please feel free to the call the clinic at anytime at [phone]. It was a pleasure seeing you!  Best, Dr. Rayvon Char

## 2023-05-08 ENCOUNTER — Telehealth: Payer: Self-pay

## 2023-05-08 NOTE — Progress Notes (Signed)
Internal Medicine Clinic Attending  I was physically present during the key portions of the resident provided service and participated in the medical decision making of patient's management care. I reviewed pertinent patient test results.  The assessment, diagnosis, and plan were formulated together and I agree with the documentation in the resident's note.  Dustin Eon, MD    This is a challenging situation because I'm not at all sure what medicines Dustin Fowler is taking. I don't want to make adjustments blindly. He is off work on Fridays, so we will schedule him for a follow up in 1 week on Friday 11/22, either in person or telemedicine. At that visit, we need to do a more thorough med rec and review of home BP's before making adjustments

## 2023-05-08 NOTE — Telephone Encounter (Signed)
Decision: Dustin Fowler (Key: [id number]) Need Help? Call us at [phone] Outcome Additional Information Required Your PA has been resolved, no additional PA is required. For further inquiries please contact the number on the back of the member prescription card. (Message [phone]) Drug Entresto 49-51MG  tablets ePA cloud Psychologist, educational Electronic PA Form [phone] NCPDP)

## 2023-05-08 NOTE — Telephone Encounter (Signed)
Prior Authorization for patient Dustin Fowler 49/51 mg tablets) came through on cover my meds was submitted awaiting approval or denial.  [id number]

## 2023-05-12 ENCOUNTER — Telehealth: Payer: Self-pay | Admitting: Internal Medicine

## 2023-05-12 ENCOUNTER — Telehealth: Payer: 59 | Admitting: Internal Medicine

## 2023-05-12 DIAGNOSIS — I25118 Atherosclerotic heart disease of native coronary artery with other forms of angina pectoris: Secondary | ICD-10-CM

## 2023-05-12 DIAGNOSIS — I10 Essential (primary) hypertension: Secondary | ICD-10-CM

## 2023-05-12 MED ORDER — CARVEDILOL 12.5 MG PO TABS: 12.5000 mg | ORAL_TABLET | Freq: Two times a day (BID) | ORAL | 6 refills | Status: DC

## 2023-05-12 NOTE — Progress Notes (Deleted)
CC: medication reconcilitation   HPI:  [name] is a 62 y.o. male living with a history stated below and presents today for a thorough medication reconciliation. Please see problem based assessment and plan for additional details.  Past Medical History:  Diagnosis Date   CAD (coronary artery disease)    CHF (congestive heart failure) (HCC)    Hypertension    Prediabetes     Current Outpatient Medications on File Prior to Visit  Medication Sig Dispense Refill   albuterol (VENTOLIN HFA) 108 (90 Base) MCG/ACT inhaler Inhale 2 puffs into the lungs every 6 (six) hours as needed for wheezing or shortness of breath. 6.7 g 2   aspirin EC 81 MG tablet Take 1 tablet (81 mg total) by mouth daily. Swallow whole. 30 tablet 12   atorvastatin (LIPITOR) 80 MG tablet Take 1 tablet (80 mg total) by mouth daily. 90 tablet 3   carvedilol (COREG) 12.5 MG tablet Take 1 tablet (12.5 mg total) by mouth 2 (two) times daily with a meal. 60 tablet 6   empagliflozin (JARDIANCE) 10 MG TABS tablet Take 1 tablet (10 mg total) by mouth daily before breakfast. 30 tablet 1   furosemide (LASIX) 40 MG tablet Take 1 tablet (40 mg total) by mouth once a week. 16 tablet 1   nitroGLYCERIN (NITROSTAT) 0.4 MG SL tablet Place 1 tablet (0.4 mg total) under the tongue every 5 (five) minutes x 3 doses as needed for chest pain. 25 tablet 6   sacubitril-valsartan (ENTRESTO) 49-51 MG Take 1 tablet by mouth 2 (two) times daily. 60 tablet 0   spironolactone (ALDACTONE) 25 MG tablet Take 1 tablet (25 mg total) by mouth daily. 30 tablet 2   varenicline (CHANTIX CONTINUING MONTH PAK) 1 MG tablet Take 1 tablet (1 mg total) by mouth 2 (two) times daily. 168 tablet 0   varenicline (CHANTIX) 0.5 MG tablet Take one 0.5-milligram tablet once a day for 3 days, then increase to one 0.5-milligram tablet twice a day for 4 days. The dose is slowly increased to lessen the chance of side effects (such as nausea, unusual dreams). Begin  taking varenicline as directed 1 to 2 weeks before the quit date. 11 tablet 0   No current facility-administered medications on file prior to visit.    No family history on file.  Social History   Socioeconomic History   Marital status: Single    Spouse name: Not on file   Number of children: Not on file   Years of education: Not on file   Highest education level: Not on file  Occupational History   Not on file  Tobacco Use   Smoking status: Every Day    Current packs/day: 0.00    Types: Cigarettes    Start date: 07/24/1980    Last attempt to quit: 07/24/2020    Years since quitting: 2.8   Smokeless tobacco: Never   Tobacco comments:    willing to stop.   Vaping Use   Vaping status: Never Used  Substance and Sexual Activity   Alcohol use: Yes    Alcohol/week: 4.0 standard drinks of alcohol    Types: 4 Cans of beer per week    Comment: 1 40oz can a day, everyday.   Drug use: Not Currently   Sexual activity: Not on file  Other Topics Concern   Not on file  Social History Narrative   ** Merged History Encounter **       Social Determinants of  Health   Financial Resource Strain: High Risk (07/27/2020)   Overall Financial Resource Strain (CARDIA)    Difficulty of Paying Living Expenses: Hard  Food Insecurity: No Food Insecurity (03/03/2023)   Hunger Vital Sign    Worried About Running Out of Food in the Last Year: Never true    Ran Out of Food in the Last Year: Never true  Transportation Needs: No Transportation Needs (03/03/2023)   PRAPARE - Administrator, Civil Service (Medical): No    Lack of Transportation (Non-Medical): No  Physical Activity: Insufficiently Active (07/27/2020)   Exercise Vital Sign    Days of Exercise per Week: 4 days    Minutes of Exercise per Session: 30 min  Stress: Not on file  Social Connections: Not on file  Intimate Partner Violence: Not At Risk (03/03/2023)   Humiliation, Afraid, Rape, and Kick questionnaire    Fear of Current  or Ex-Partner: No    Emotionally Abused: No    Physically Abused: No    Sexually Abused: No    Review of Systems: ROS negative except for what is noted on the assessment and plan.  There were no vitals filed for this visit.  Physical Exam: Constitutional: well-appearing *** sitting in ***, in no acute distress HENT: normocephalic atraumatic, mucous membranes moist Eyes: conjunctiva non-erythematous Cardiovascular: regular rate and rhythm, no m/r/g Pulmonary/Chest: normal work of breathing on room air, lungs clear to auscultation bilaterally Abdominal: soft, non-tender, non-distended MSK: normal bulk and tone Neurological: alert & oriented x 3, no focal deficit Skin: warm and dry Psych: normal mood and behavior  Assessment & Plan:   CHF/HTN: - aspirin 81 mg daily - lipitor 80 mg daily - coreg 12.5 mg bid - jardiance 10 - lasix 40 weekly - nitroglycerin PRN - entresto 49-51 bid - spiro 25  Tobacco: - chantix?  Patient {GC/GE:[id number]::"discussed with","seen with"} Dr. {NFAOZ:[id number]::"IONGEXBM","W. Hoffman","Mullen","Narendra","Vincent","Guilloud","Lau","Machen"}  No problem-specific Assessment & Plan notes found for this encounter.   Elza Rafter, D.O. [facility] Internal Medicine, PGY-3 Phone: [phone] Date 05/12/2023 Time 7:15 AM

## 2023-05-12 NOTE — Telephone Encounter (Signed)
The patient was not able to make his in-person appt with me today, as he had work. The purpose of the visit was to do a thorough medication reconciliation so I called the patient to do this.  The patient is taking: Aspirin 81 mg Spironolactone 25 mg daily  Lasix 40 mg once a week Monday Carvedilol 12.5 mg bid -- need refill Certavite antioxidant once daily  The medications he is prescribed, but not taking: Entresto 49-51 mg bid (unaffordable, but may be able to afford after next pay check) Lipitor 80 mg daily Jardiance 10 mg daily Chantix    The patient does not check his blood pressure at home, but it was elevated in the clinic to 184/104. I advised him to pick up the [facility] as soon as he can, if he is able to afford it. He also needs to take jardiance for GDMT and his statin, given his history of CAD. These should be at his pharmacy, but just need picked up. I have sent a message to [facility] to see if there is any patient assistance we can provide.   I will not make any dose adjustments today since the patient is off of a few medications. When he is able to afford the medication, or get patient assistance, this will help his BP. Will send the front desk a message to schedule him an in person visit in a few weeks when he is hopefully able to start back more meds.   Discussed with Dr. Lafonda Mosses

## 2023-05-15 ENCOUNTER — Telehealth: Payer: Self-pay

## 2023-05-15 ENCOUNTER — Other Ambulatory Visit (HOSPITAL_COMMUNITY): Payer: Self-pay

## 2023-05-15 MED ORDER — ENTRESTO 49-51 MG PO TABS: 1.0000 | ORAL_TABLET | Freq: Two times a day (BID) | ORAL | 0 refills | Status: DC

## 2023-05-15 NOTE — Telephone Encounter (Signed)
Spoke with patient on Friday 11/22 regarding his meds. He was having difficulties affording Entresto. Dustin Fowler advised me that this patient's medication was so expensive since his insurance does not allow meds to be filled at [facility].  He was given info on a copay card that will make the rx $10. Sent new rx to [facility].

## 2023-05-15 NOTE — Telephone Encounter (Signed)
-----   Message from Chauncey Mann sent at 05/12/2023  3:36 PM EST ----- Regarding: patient assistance Dustin Fowler,  This patient is having a lot of difficulties affording some of his medications, specifically his Entresto. Are we able to help him with any patient assistance?

## 2023-05-15 NOTE — Addendum Note (Signed)
Addended by: Elza Rafter on: 05/15/2023 03:52 PM   Modules accepted: Orders

## 2023-05-15 NOTE — Telephone Encounter (Signed)
Spoke with [facility] regarding Entresto copay.   Was able to provide a $10 Entresto copay card however the insurance they had on file needed to be updated. Patient has Community education officer with a plan that doesn't allow patient to fill medication at [facility]. He will need to transfer medication to another pharmacy. Pharmacy will give new pharmacy Entresto copay card info to use.  Also patients jardiance will require a prior auth.  Entresto copay card info:

## 2023-06-21 NOTE — Progress Notes (Deleted)
 Cardiology Office Note    Date:  06/21/2023  ID:  Dustin Fowler, DOB [birth year], MRN [id number] PCP:  Dustin Boga, MD  Cardiologist:  Dustin DELENA Merck, MD  Electrophysiologist:  Dustin ONEIDA HOLTS, MD   Chief Complaint: ***  History of Present Illness: Dustin Fowler    Dustin Fowler is a 63 y.o. male with visit-pertinent history of ***  Initially seen in the ED on 07/2020 with hypertensive urgency, he was found to have a mildly elevated troponin in the setting of volume overload.  Echocardiogram during admission indicated LVEF of 35 to 40%, grade 3 diastolic dysfunction, akinetic apex, severely dilated left atrium, mildly reduced RV.  He underwent left heart catheterization on 07/27/2020 showing severe single-vessel CAD with chronic total/subtotal occlusion of distal RCA/RPL LAD with bridging collaterals, mild to moderate nonobstructive CAD in the LAD.  Continue medical therapy was recommended.  Is felt that his presentation of cardiomyopathy was less likely nonischemic as his degree of LV dysfunction was out of portion his coronary disease.  He was started on GDMT with Coreg  6.25 mg twice daily, Farxiga  10 mg daily, Entresto  97/103 mg twice daily, hydralazine  50 mg 3 times daily and Imdur  30 mg daily in addition to aspirin  and atorvastatin  80 mg daily.  He underwent cardiac MRI showing hypertrophic cardiomyopathy, variant morphology with diffuse hypertrophy.  Mild to moderately reduced LV systolic function with normal LV chamber, LVEF 42%, mild inferior wall hypokinesis.  He was referred to EP evaluated by Dr. Holts in 11/2020 who felt there was indication for ICD given cardiac MRI findings with reduced EF.  Patient plan to apply for Medicaid and once approved was to be scheduled for defibrillator.  Patient was then lost to follow-up.  On 03/03/2023 he presented to the ED with complaints of increasing shortness of breath, orthopnea and PND.  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  He reported that he had stopped all of his cardiac medications for at least a year as he felt that they were contributing to his headaches.  He denied any chest pain or significant lower extremity edema.  In the emergency room his labs showed sodium 140, potassium 4.5, creatinine 1.6, BNP 217, high sensitive troponin 152, hemoglobin 14.2.  Chest x-ray with cardiac enlargement and vascular congestion.  His EKG showed sinus tachycardia at 116 bpm with LVH.  Echocardiogram on 03/04/2023 indicated LVEF of 30 to 35%, LV with moderately decreased function, with demonstration of wall motion abnormalities, LV internal cavity size moderately dilated, severe LVH.  Patient was discharged on 03/04/2023 on aspirin  81 mg daily, Lipitor  80 mg daily, carvedilol  12.5 mg twice daily, furosemide  40 mg once weekly, Entresto  24-26 mg twice daily, spironolactone  25 mg daily.   HFrEF/hypertrophic cardiomyopathy: Echo in 2022 with LVEF 35 to 40%. Cardiac MRI showed hypertrophic cardiomyopathy, variant morphology with diffuse hypertrophy.  Mild to moderately reduced LV systolic function with normal LV chamber, LVEF 42%, mild inferior wall hypokinesis. Echocardiogram on 03/04/2023 indicated LVEF of 30 to 35%, LV with moderately decreased function, with demonstration of wall motion abnormalities, LV internal cavity size moderately dilated, severe LVH.   Today patient appears euvolemic and well compensated on exam. Given further worsened EF will refer back to EP for consideration of defibrillator. Start  CAD: Left heart catheterization on 07/27/2020 showing severe single-vessel CAD with chronic total/subtotal occlusion of distal RCA/RPL LAD with bridging collaterals, mild to moderate nonobstructive CAD in the LAD.  Continue medical therapy was recommended.  Hypertension: Blood pressure today  Start ETOH/Tobacco use: Patient reports that he currently CKD stage IIIa:   Labwork independently reviewed: 03/21/2023: Sodium 141,  potassium 5.2, creatinine 1.36  ROS: .   *** denies chest pain, shortness of breath, lower extremity edema, fatigue, palpitations, melena, hematuria, hemoptysis, diaphoresis, weakness, presyncope, syncope, orthopnea, and PND.  All other systems are reviewed and otherwise negative.  Studies Reviewed: Dustin Fowler    EKG:  EKG is ordered today, personally reviewed, demonstrating ***     CV Studies:  Cardiac Studies & Procedures   CARDIAC CATHETERIZATION  CARDIAC CATHETERIZATION 07/27/2020  Narrative Conclusions: 1. Severe single vessel coronary artery disease with chronic total/subtotal occlusion of the distal RCA/rPLA with bridging collaterals.  Mild to moderate, non-obstructive CAD noted in the left coronary artery. 2. Mildly elevated left and right heart filling pressures. 3. Normal Fick cardiac output/index.  Recommendations: 1. Continue medical therapy of acute systolic heart failure.  Deedra this representing mostly non-ischemic cardiomyopathy as degree of LV dysfunction is out of proportion to coronary artery disease. 2. Aggressive secondary prevention of CAD.  Lonni Hanson, MD [facility]  Findings Coronary Findings Diagnostic  Dominance: Right  Left Main Vessel is large. Vessel is angiographically normal.  Left Anterior Descending Vessel is large. There is mild diffuse disease throughout the vessel.  First Diagonal Branch Vessel is moderate in size.  Second Diagonal Branch Vessel is small in size.  Third Diagonal Branch Vessel is small in size.  Left Circumflex Vessel is large. Dist Cx lesion is 50% stenosed.  First Obtuse Marginal Branch Vessel is moderate in size.  Second Obtuse Marginal Branch Vessel is large in size.  Third Obtuse Marginal Branch Vessel is moderate in size.  Right Coronary Artery Vessel is moderate in size. Mid RCA lesion is 40% stenosed.  Right Posterior Atrioventricular Artery Collaterals RPAV filled by collaterals from  RPAV.  RPAV lesion is 100% stenosed. The lesion is chronically occluded with bridging collateral flow.  Intervention  No interventions have been documented.    ECHOCARDIOGRAM  ECHOCARDIOGRAM COMPLETE 03/03/2023  Narrative ECHOCARDIOGRAM REPORT    Patient Name:   JAHMAI FINELLI Date of Exam: 03/03/2023 Medical Rec #:  [id number]          Height:       70.0 in Accession #:    [id number]         Weight:       225.0 lb Date of Birth:  09/29/1960          BSA:          2.194 m Patient Age:    61 years           BP:           197/96 mmHg Patient Gender: M                  HR:           106 bpm. Exam Location:  Inpatient  Procedure: 2D Echo, Cardiac Doppler and Color Doppler  Indications:    Dyspnea R06.00  History:        Patient has prior history of Echocardiogram examinations, most recent 08/27/2020. CHF and Hypertrophic Cardiomyopathy, CAD, Signs/Symptoms:Dyspnea; Risk Factors:Hypertension and Current Smoker. CKD, stage 3.  Sonographer:    Thea Norlander Referring Phys: [id number] JULIE MACHEN   Sonographer Comments: Patient refused Definity . IMPRESSIONS   1. Left ventricular ejection fraction, by estimation, is 30 to 35%. The left ventricle has moderately decreased function. The left  ventricle demonstrates regional wall motion abnormalities (see scoring diagram/findings for description). The left ventricular internal cavity size was moderately dilated. There is severe left ventricular hypertrophy. Left ventricular diastolic parameters are indeterminate. 2. Right ventricular systolic function is normal. The right ventricular size is normal. There is normal pulmonary artery systolic pressure. 3. Left atrial size was moderately dilated. 4. The mitral valve is grossly normal. Mild mitral valve regurgitation. No evidence of mitral stenosis. 5. The aortic valve is grossly normal. Unable to determine aortic valve morphology due to image quality. Aortic valve regurgitation is not  visualized. No aortic stenosis is present. 6. The inferior vena cava is dilated in size with >50% respiratory variability, suggesting right atrial pressure of 8 mmHg.  FINDINGS Left Ventricle: Left ventricular ejection fraction, by estimation, is 30 to 35%. The left ventricle has moderately decreased function. The left ventricle demonstrates regional wall motion abnormalities. The left ventricular internal cavity size was moderately dilated. There is severe left ventricular hypertrophy. Left ventricular diastolic parameters are indeterminate.   LV Wall Scoring: The inferior wall, mid inferoseptal segment, and basal inferoseptal segment are akinetic. The anterior wall, mid and distal lateral wall, entire anterior septum, mid anterolateral segment, and apical inferior segment are hypokinetic. The basal inferolateral segment and basal anterolateral segment are normal.  Right Ventricle: The right ventricular size is normal. No increase in right ventricular wall thickness. Right ventricular systolic function is normal. There is normal pulmonary artery systolic pressure. The tricuspid regurgitant velocity is 1.86 m/s, and with an assumed right atrial pressure of 8 mmHg, the estimated right ventricular systolic pressure is 21.8 mmHg.  Left Atrium: Left atrial size was moderately dilated.  Right Atrium: Right atrial size was normal in size.  Pericardium: There is no evidence of pericardial effusion.  Mitral Valve: The mitral valve is grossly normal. Mild mitral valve regurgitation. No evidence of mitral valve stenosis.  Tricuspid Valve: The tricuspid valve is normal in structure. Tricuspid valve regurgitation is trivial. No evidence of tricuspid stenosis.  Aortic Valve: The aortic valve is grossly normal. Aortic valve regurgitation is not visualized. No aortic stenosis is present. Aortic valve peak gradient measures 8.6 mmHg.  Pulmonic Valve: The pulmonic valve was normal in structure. Pulmonic  valve regurgitation is trivial. No evidence of pulmonic stenosis.  Aorta: The aortic root is normal in size and structure.  Venous: The inferior vena cava is dilated in size with greater than 50% respiratory variability, suggesting right atrial pressure of 8 mmHg.  IAS/Shunts: No atrial level shunt detected by color flow Doppler.   LEFT VENTRICLE PLAX 2D LVIDd:         6.30 cm   Diastology LVIDs:         4.40 cm   LV e' medial:    6.64 cm/s LV PW:         1.30 cm   LV E/e' medial:  13.6 LV IVS:        1.50 cm   LV e' lateral:   11.90 cm/s LVOT diam:     2.40 cm   LV E/e' lateral: 7.6 LV SV:         63 LV SV Index:   29 LVOT Area:     4.52 cm   RIGHT VENTRICLE             IVC RV S prime:     18.60 cm/s  IVC diam: 2.60 cm TAPSE (M-mode): 2.5 cm  LEFT ATRIUM  Index        RIGHT ATRIUM           Index LA diam:        5.80 cm 2.64 cm/m   RA Area:     17.10 cm LA Vol (A2C):   84.5 ml 38.51 ml/m  RA Volume:   39.70 ml  18.09 ml/m LA Vol (A4C):   92.5 ml 42.15 ml/m LA Biplane Vol: 98.6 ml 44.93 ml/m AORTIC VALVE AV Area (Vmax): 3.19 cm AV Vmax:        147.00 cm/s AV Peak Grad:   8.6 mmHg LVOT Vmax:      103.77 cm/s LVOT Vmean:     64.200 cm/s LVOT VTI:       0.140 m  AORTA Ao Root diam: 3.50 cm Ao Asc diam:  3.50 cm  MITRAL VALVE               TRICUSPID VALVE MV Area (PHT): 5.02 cm    TR Peak grad:   13.8 mmHg MV Decel Time: 151 msec    TR Vmax:        186.00 cm/s MR Peak grad: 56.9 mmHg MR Vmax:      377.00 cm/s  SHUNTS MV E velocity: 90.50 cm/s  Systemic VTI:  0.14 m MV A velocity: 79.20 cm/s  Systemic Diam: 2.40 cm MV E/A ratio:  1.14  Dustin Merck MD Electronically signed by Dustin Merck MD Signature Date/Time: 03/04/2023/6:42:44 AM    Final     CARDIAC MRI  MR CARDIAC MORPHOLOGY W WO CONTRAST 10/19/2020  Narrative CLINICAL DATA:  Cardiomyopathy, follow up  COMPARISON: TTE 07/25/20, 08/27/20  EXAM: CARDIAC MRI  TECHNIQUE: The  patient was scanned on a 1.5 Tesla GE magnet. A dedicated cardiac coil was used. Functional imaging was done using Fiesta sequences. 2,3, and 4 chamber views were done to assess for RWMA's. Modified Simpson's rule using a short axis stack was used to calculate an ejection fraction on a dedicated work [employer]. The patient received 10mL GADAVIST  GADOBUTROL  1 MMOL/ML IV SOLN. After 10 minutes inversion recovery sequences were used to assess for infiltration and scar tissue.  FINDINGS: LEFT VENTRICLE:  Normal left ventricular chamber size.  Severely increased left ventricular wall thickness. Maximal wall thickness 28 mm in the basal anterior and anterolateral and mid lateral wall.  There is no systolic anterior motion of the mitral valve. No flow dephasing - no left ventricular outflow tract obstruction. Mitral regurgitation is not seen.  No evidence of left ventricular apical aneurysm.  Findings are consistent with hypertrophic cardiomyopathy without obstruction. Morphologic subtype: Variant morphology. Findings most suggestive of variant reverse curve morphology vs variant apical hypertrophic cardiomyopathy.  No myocardial edema, T2 51 msec.  Grossly normal first pass perfusion.  Mild-moderately reduced left ventricular systolic function.  LVEF = 42%  There are regional wall motion abnormalities. Mild inferior wall hypokinesis. Mild abnormal septal motion at the basal septum, likely secondary to conduction delay.  There is post contrast delayed myocardial enhancement as noted:  Base - there is diffuse subendocardial and midmyocardial delayed enhancement in the anteroseptum, anterior wall, and lateral wall. There is also focal subendocardial delayed enhancement in the inferoseptum and inferior wall.  Mid- diffuse subendocardial and midmyocardial delayed enhancement in the septum, anterior wall, and lateral wall.  Apex - large, discreet foci of  delayed enhancement in the mid-apical anterior, lateral and inferior walls. At the distal most portion of the apex, there is discreet  transmural delayed enhancement in the anterior wall.  Visually there is LGE in >15% of the myocardial mass. Challenging to quantitate given mild motion artifact.  Normal T1 myocardial nulling kinetics suggest against a diagnosis of cardiac amyloidosis. ECV = 27%, normal range.  RIGHT VENTRICLE:  Normal right ventricular chamber size.  Moderately increased right ventricular wall thickness.  Mildly reduced right ventricular systolic function.  RVEF = 43%  There are no regional wall motion abnormalities. Global hypokinesis.  No post contrast delayed myocardial enhancement.  ATRIA:  Moderate left atrial enlargement. Prominent Coumadin ridge (normal structure).  Normal right atrial size.  VALVES:  No significant valvular abnormalities.  PERICARDIUM:  Normal pericardium.  No pericardial effusion.  OTHER: No significant extracardiac findings.  MEASUREMENTS: Left ventricle:  LV male  LV EF: 42% (Normal 56-78%)  Absolute volumes:  LV EDV: 175 mL (Normal 77-195 mL)  LV ESV: 102 mL (Normal 19-72 mL)  LV SV: 73 mL (Normal 51-133 mL)  CO: 5.1 L/min (Normal 2.8-8.8 L/min)  Indexed volumes:  LV EDV: 79 mL/sq-m (Normal 47-92 mL/sq-m)  LV ESV: 46 mL/sq-m (Normal 13-30 mL/sq-m)  LV SV: 33 mL/sq-m (Normal 32-62 mL/sq-m)  CI: 2.3 L/min/sq-m (Normal 1.7-4.2 L/min/sq-m)  Right ventricle:  RV male  RV EF: 43 % (Normal 47-74%)  Absolute volumes:  RV EDV: 156  ML (Normal 88-227 mL)  RV ESV: 89mL (Normal 23-103 mL)  RV SV: 67  ML (normal 52-138 mL)  CO: 4.7  L/min (Normal 2.8-8.8 L/min)  Indexed volumes:  RV EDV: 70 mL/sq-m (Normal 55-105 mL/sq-m)  RV ESV: 40 mL/sq-m (Normal 15-43 mL/sq-m)  RV SV: 30 mL/sq-m (Normal 32-64 mL/sq-m)  CI: 2.12 L/min/sq-m (Normal 1.7-4.2 L/min/sq-m)  IMPRESSION: 1. Hypertrophic  cardiomyopathy, variant morphology with diffuse hypertrophy. See findings. Maximal wall thickness 28 mm in the basal anterior and anterolateral and mid lateral wall.  2. No LV apical pouch. No LVOT obstruction. No systolic anterior motion of the mitral valve.  3. Mild-moderately reduced LV systolic function with normal LV chamber size, LVEF 42%. Mild inferior wall hypokinesis. Mild abnormal septal motion at the basal septum, likely secondary to conduction delay.  4. Diffuse post contrast delayed myocardial enhancement from base to apex, most prominent in areas of myocardial hypertrophy, visually >15% of myocardial mass. Focal delayed enhancement at the apex in the anterior wall.  5. Mildly reduced right ventricular systolic function, normal right ventricular chamber size. RVEF 43%.   Electronically Signed By: Gayatri  Acharya On: 10/25/2020 16:46           Current Reported Medications:.    No outpatient medications have been marked as taking for the 06/23/23 encounter (Appointment) with Shawntell Dixson D, NP.    Physical Exam:    VS:  There were no vitals taken for this visit.   Wt Readings from Last 3 Encounters:  05/05/23 244 lb 1.6 oz (110.7 kg)  03/21/23 245 lb 4.8 oz (111.3 kg)  03/04/23 236 lb 8 oz (107.3 kg)    GEN: Well nourished, well developed in no acute distress NECK: No JVD; No carotid bruits CARDIAC: ***RRR, no murmurs, rubs, gallops RESPIRATORY:  Clear to auscultation without rales, wheezing or rhonchi  ABDOMEN: Soft, non-tender, non-distended EXTREMITIES:  No edema; No acute deformity   Asessement and Plan:.     ***     Disposition: F/u with ***  Signed, Raoul Ciano D Vyom Brass, NP

## 2023-06-23 ENCOUNTER — Ambulatory Visit: Payer: Medicaid Other | Attending: Cardiology | Admitting: Cardiology

## 2023-06-23 ENCOUNTER — Encounter: Payer: Medicaid Other | Admitting: Student

## 2023-06-23 DIAGNOSIS — I25118 Atherosclerotic heart disease of native coronary artery with other forms of angina pectoris: Secondary | ICD-10-CM

## 2023-06-23 DIAGNOSIS — I10 Essential (primary) hypertension: Secondary | ICD-10-CM

## 2023-06-23 DIAGNOSIS — N1831 Chronic kidney disease, stage 3a: Secondary | ICD-10-CM

## 2023-06-23 DIAGNOSIS — I422 Other hypertrophic cardiomyopathy: Secondary | ICD-10-CM

## 2023-06-23 NOTE — Progress Notes (Deleted)
 CC: HTN follow up  HPI:  [name] is a 63 y.o. male living with a history stated below and presents today for ***. Please see problem based assessment and plan for additional details.  Past Medical History:  Diagnosis Date   CAD (coronary artery disease)    CHF (congestive heart failure) (HCC)    Hypertension    Prediabetes     Current Outpatient Medications on File Prior to Visit  Medication Sig Dispense Refill   albuterol  (VENTOLIN  HFA) 108 (90 Base) MCG/ACT inhaler Inhale 2 puffs into the lungs every 6 (six) hours as needed for wheezing or shortness of breath. 6.7 g 2   aspirin  EC 81 MG tablet Take 1 tablet (81 mg total) by mouth daily. Swallow whole. 30 tablet 12   atorvastatin  (LIPITOR ) 80 MG tablet Take 1 tablet (80 mg total) by mouth daily. 90 tablet 3   carvedilol  (COREG ) 12.5 MG tablet Take 1 tablet (12.5 mg total) by mouth 2 (two) times daily with a meal. 60 tablet 6   empagliflozin  (JARDIANCE ) 10 MG TABS tablet Take 1 tablet (10 mg total) by mouth daily before breakfast. 30 tablet 1   furosemide  (LASIX ) 40 MG tablet Take 1 tablet (40 mg total) by mouth once a week. 16 tablet 1   nitroGLYCERIN  (NITROSTAT ) 0.4 MG SL tablet Place 1 tablet (0.4 mg total) under the tongue every 5 (five) minutes x 3 doses as needed for chest pain. 25 tablet 6   sacubitril -valsartan  (ENTRESTO ) 49-51 MG Take 1 tablet by mouth 2 (two) times daily. 60 tablet 0   spironolactone  (ALDACTONE ) 25 MG tablet Take 1 tablet (25 mg total) by mouth daily. 30 tablet 2   varenicline  (CHANTIX  CONTINUING MONTH PAK) 1 MG tablet Take 1 tablet (1 mg total) by mouth 2 (two) times daily. 168 tablet 0   varenicline  (CHANTIX ) 0.5 MG tablet Take one 0.5-milligram tablet once a day for 3 days, then increase to one 0.5-milligram tablet twice a day for 4 days. The dose is slowly increased to lessen the chance of side effects (such as nausea, unusual dreams). Begin taking varenicline  as directed 1 to 2 weeks before  the quit date. 11 tablet 0   No current facility-administered medications on file prior to visit.     Review of Systems: ROS negative except for what is noted on the assessment and plan.  There were no vitals filed for this visit.  Physical Exam: Constitutional: Well appearing, not in acute distress. Cardiovascular: regular rate and rhythm, no m/r/g Pulmonary/Chest: normal work of breathing on room air, lungs clear to auscultation bilaterally Abdominal: soft, non-tender, non-distended  Assessment & Plan:   Patient last seen in the clinic on 03/21/2023 for hospital follow-up.  Today, patient notes he has had difficulty with taking his medications and managing his follow-up with cardiology.  His blood pressure is elevated today at 184/104.  Patient is unsure what medications he has been taking but notes he has been taking 3 at home.  There is concern on his last visit for persistent weight gain, but his weight is stable today.  He has been taking his Lasix  once per week and is euvolemic on exam today.  Patient has also not been measuring his blood pressure at home so he was encouraged to begin doing this.  Patient is overwhelmed with his concomitant diagnoses. We will resend in his prescriptions and have them pick these up.  Will follow-up with the patient 1 week to confirm what  medications he has been taking.  Patient will also follow-up with cardiology for further workup regarding his other comorbidities.  Patient was encouraged to bring his pill bottles to [facility] if he does not get duplicates.  We will not change any medications for the patient at this time as he is unsure what he is taking. - Follow-up in 1 week to confirm medications patient is taking - Cardiology referral - Re-send medications for patient to pick up  Patient {GC/GE:[id number]::discussed with,seen with} Dr. {[id number]::Tpoopjfd,Z. Hoffman,Mullen,Narendra,Vincent,Guilloud,Lau,Machen}  No  problem-specific Assessment & Plan notes found for this encounter.   Missy Sandhoff, MD [facility], PGY-1 Phone: [phone] Date 06/23/2023 Time 6:21 AM

## 2023-06-28 ENCOUNTER — Encounter: Payer: Self-pay | Admitting: Internal Medicine

## 2023-08-24 ENCOUNTER — Other Ambulatory Visit: Payer: Self-pay

## 2023-08-24 ENCOUNTER — Emergency Department (HOSPITAL_COMMUNITY)
Admission: EM | Admit: 2023-08-24 | Discharge: 2023-08-24 | Disposition: A | Discharge status: Home / Self Care | Attending: Emergency Medicine | Admitting: Emergency Medicine

## 2023-08-24 ENCOUNTER — Emergency Department (HOSPITAL_COMMUNITY)

## 2023-08-24 ENCOUNTER — Encounter (HOSPITAL_COMMUNITY): Payer: Self-pay

## 2023-08-24 DIAGNOSIS — I509 Heart failure, unspecified: Secondary | ICD-10-CM | POA: Diagnosis not present

## 2023-08-24 DIAGNOSIS — I2510 Atherosclerotic heart disease of native coronary artery without angina pectoris: Secondary | ICD-10-CM | POA: Diagnosis not present

## 2023-08-24 DIAGNOSIS — Z7982 Long term (current) use of aspirin: Secondary | ICD-10-CM | POA: Diagnosis not present

## 2023-08-24 DIAGNOSIS — M21612 Bunion of left foot: Secondary | ICD-10-CM | POA: Insufficient documentation

## 2023-08-24 DIAGNOSIS — M79672 Pain in left foot: Secondary | ICD-10-CM | POA: Diagnosis present

## 2023-08-24 MED ORDER — OXYCODONE-ACETAMINOPHEN 5-325 MG PO TABS
1.0000 | ORAL_TABLET | ORAL | Status: DC | PRN
  Administered 2023-08-24: 1 via ORAL
  Filled 2023-08-24: qty 1

## 2023-08-24 NOTE — ED Triage Notes (Signed)
 Pt arrived from home via [facility] c/o left great toe and left foot pain and swelling. Pt states that he has a bunion forming. 9/10 on pain scale.

## 2023-08-24 NOTE — ED Provider Notes (Addendum)
 [place] EMERGENCY DEPARTMENT AT [facility] Provider Note   CSN: [id number] Arrival date & time: 08/24/23  0017     History  Chief Complaint  Patient presents with   Foot Pain    Dustin Fowler is a 63 y.o. male history of bunion on the right foot, CAD, renal insufficiency, CHF presented for bunion on his left foot.  Patient states he just wanted pain meds and have an x-ray to confirm bunion.  Patient is no history of gout.  Patient denies any fevers.  Patient states he can bear weight just hurts to bear weight.  Patient able to move the toes slightly but states is limited by pain.  Patient denies any trauma to the area.  Home Medications Prior to Admission medications   Medication Sig Start Date End Date Taking? Authorizing Provider  albuterol (VENTOLIN HFA) 108 (90 Base) MCG/ACT inhaler Inhale 2 puffs into the lungs every 6 (six) hours as needed for wheezing or shortness of breath. 03/04/23   Lovie Macadamia, MD  aspirin EC 81 MG tablet Take 1 tablet (81 mg total) by mouth daily. Swallow whole. 05/05/23   Morrie Sheldon, MD  atorvastatin (LIPITOR) 80 MG tablet Take 1 tablet (80 mg total) by mouth daily. 05/05/23   Morrie Sheldon, MD  carvedilol (COREG) 12.5 MG tablet Take 1 tablet (12.5 mg total) by mouth 2 (two) times daily with a meal. 05/12/23   Atway, Rayann N, DO  empagliflozin (JARDIANCE) 10 MG TABS tablet Take 1 tablet (10 mg total) by mouth daily before breakfast. 05/05/23   Morrie Sheldon, MD  furosemide (LASIX) 40 MG tablet Take 1 tablet (40 mg total) by mouth once a week. 05/05/23   Morrie Sheldon, MD  nitroGLYCERIN (NITROSTAT) 0.4 MG SL tablet Place 1 tablet (0.4 mg total) under the tongue every 5 (five) minutes x 3 doses as needed for chest pain. 03/04/23   Rocky Morel, DO  sacubitril-valsartan (ENTRESTO) 49-51 MG Take 1 tablet by mouth 2 (two) times daily. 05/15/23   Atway, Rayann N, DO  spironolactone (ALDACTONE) 25 MG tablet Take 1 tablet (25 mg  total) by mouth daily. 05/05/23   Morrie Sheldon, MD  varenicline (CHANTIX CONTINUING MONTH PAK) 1 MG tablet Take 1 tablet (1 mg total) by mouth 2 (two) times daily. 03/21/23   Lovie Macadamia, MD  varenicline (CHANTIX) 0.5 MG tablet Take one 0.5-milligram tablet once a day for 3 days, then increase to one 0.5-milligram tablet twice a day for 4 days. The dose is slowly increased to lessen the chance of side effects (such as nausea, unusual dreams). Begin taking varenicline as directed 1 to 2 weeks before the quit date. 03/21/23   Lovie Macadamia, MD      Allergies    Patient has no known allergies.    Review of Systems   Review of Systems  Physical Exam Updated Vital Signs BP (!) 178/105 (BP Location: Right Arm)   Pulse 77   Temp 98.6 F (37 C) (Oral)   Resp 15   Ht 5\' 10"  (1.778 m)   Wt 102.1 kg   SpO2 99%   BMI 32.28 kg/m  Physical Exam Vitals reviewed.  Constitutional:      General: He is not in acute distress. Cardiovascular:     Rate and Rhythm: Normal rate.     Pulses: Normal pulses.  Musculoskeletal:     Comments: Left first MTP joint, tender to palpation, able to range toe, no signs of obvious deformity or  open wounds Pain not out of proportion Soft compartments  Skin:    General: Skin is warm and dry.     Capillary Refill: Capillary refill takes less than 2 seconds.     Comments: No overlying skin color changes or warmth noted  Neurological:     Mental Status: He is alert.     Comments: Sensation intact distally  Psychiatric:        Mood and Affect: Mood normal.     ED Results / Procedures / Treatments   Labs (all labs ordered are listed, but only abnormal results are displayed) Labs Reviewed - No data to display  EKG None  Radiology DG Foot Complete Left Result Date: 08/24/2023 CLINICAL DATA:  Pain, feels like a bunion EXAM: LEFT FOOT - COMPLETE 3+ VIEW COMPARISON:  None Available. FINDINGS: There is no evidence of fracture or dislocation. Hallux  valgus. Calcaneal spurs. Soft tissues are unremarkable. IMPRESSION: No acute fracture or dislocation. Hallux valgus. Electronically Signed   By: Minerva Fester M.D.   On: 08/24/2023 01:30    Procedures Procedures    Medications Ordered in ED Medications  oxyCODONE-acetaminophen (PERCOCET/ROXICET) 5-325 MG per tablet 1 tablet (1 tablet Oral Given 08/24/23 0038)    ED Course/ Medical Decision Making/ A&P                                 Medical Decision Making Amount and/or Complexity of Data Reviewed Radiology: ordered.  Risk Prescription drug management.   Dustin Fowler 63 y.o. presented today for left foot pain. Working DDx that I considered at this time includes, but not limited to, bunion, contusion, strain/sprain, fracture, dislocation, neurovascular compromise, septic joint, ischemic limb, compartment syndrome.  R/o DDx: contusion, strain/sprain, fracture, dislocation, neurovascular compromise, septic joint, ischemic limb, compartment syndrome: These are considered less likely due to history of present illness, physical exam, labs/imaging findings.  Review of prior external notes: 05/05/2023 office visit  Unique Tests and My Independent Interpretation:  Left foot x-ray: Hallux valgus noted  Social Determinants of Health: none  Discussion with Independent Historian: None  Discussion of Management of Tests: None  Risk: Medium: prescription drug management  Risk Stratification Score: None  Plan: On exam patient was no acute distress with stable vitals.  On exam patient is neuro vastly intact but does have tenderness to his left first MTP joint.  Patient has no history of gout and the joint is not edematous warm or erythematous.  Patient states that this is like his other foot as he does have a bunion on that side and when compared to the they do appear similar.  X-ray does confirm the hallux valgus.  Will have patient follow-up with orthopedics encouraged him to use  Tylenol every 6 hours needed for pain along with ice and to wear wide fitting shoes.  Patient is improved after medications.  At time of discharge patient was asking for narcotics for his bunion.  I explained to the patient that that is not standard practice and that he can take Tylenol every 6 hours along with rest.  I did offer crutches which the patient declined and states he will follow-up with orthopedics.  Patient was given return precautions. Patient stable for discharge at this time.  Patient verbalized understanding of plan.  This chart was dictated using voice recognition software.  Despite best efforts to proofread,  errors can occur which can change the documentation meaning.  Final Clinical Impression(s) / ED Diagnoses Final diagnoses:  Bunion of great toe of left foot    Rx / DC Orders ED Discharge Orders     None         Remi Deter 08/24/23 0303    Netta Corrigan, PA-C 08/24/23 8657    Tilden Fossa, MD 08/24/23 [phone]

## 2023-08-24 NOTE — Discharge Instructions (Addendum)
 Please follow-up with your primary care provider in regards Dustin Fowler's and ER visit.  Today your x-ray shows that you have a bunion.  You may take Tylenol every 6 hours needed pain along with ice.  Please wear wide fitting shoes.  I have attached an orthopedist he can follow-up with but you may also need to follow-up with your primary care provider as well.  If symptoms change or worsen please return to the ER.

## 2023-08-26 ENCOUNTER — Other Ambulatory Visit: Payer: Self-pay | Admitting: Student

## 2023-08-26 DIAGNOSIS — I5042 Chronic combined systolic (congestive) and diastolic (congestive) heart failure: Secondary | ICD-10-CM

## 2023-08-29 ENCOUNTER — Ambulatory Visit (INDEPENDENT_AMBULATORY_CARE_PROVIDER_SITE_OTHER): Admitting: Family

## 2023-08-29 ENCOUNTER — Encounter: Payer: Self-pay | Admitting: Family

## 2023-08-29 DIAGNOSIS — M79675 Pain in left toe(s): Secondary | ICD-10-CM

## 2023-08-29 MED ORDER — COLCHICINE 0.6 MG PO TABS: 0.6000 mg | ORAL_TABLET | Freq: Every day | ORAL | 2 refills | Status: AC

## 2023-08-29 NOTE — Progress Notes (Signed)
 Office Visit Note   Patient: Dustin Fowler           Date of Birth: December 25, 1960           MRN: [id number] Visit Date: 08/29/2023              Requested by: Lovie Macadamia, MD [address] [place],  [place] [address] PCP: Lovie Macadamia, MD  Chief Complaint  Patient presents with   Left Foot - Pain      HPI: The patient is a 63 year old gentleman who is seen for concern of left foot pain.  He reports he does have's of a chronic amount of pain to the left great toe he must be on his feet for work.  He is concerned for a bunion.  However the reason for his visit today is for a 1 week history of sudden increase of pain with swelling to the foot some redness over the IP joint of the great toe no hypersensitivity. No history of gout  Denies any changes in his diet does endorse drinking beer  Assessment & Plan: Visit Diagnoses: No diagnosis found.  Plan: Hallux valgus left great toe.  Concern for gout we will place him on a course of colchicine and have drawn a uric acid today we will call him with results.  Follow-Up Instructions: No follow-ups on file.   Ortho Exam  Patient is alert, oriented, no adenopathy, well-dressed, normal affect, normal respiratory effort. On examination left foot there is hallux valgus present with moderate edema no pitting erythema present especially over the IP joint of the great toe with tenderness there is no open area no skin breakdown pain with flexion extension great toe  Imaging: No results found. No images are attached to the encounter.  Labs: Lab Results  Component Value Date   HGBA1C 6.1 (H) 03/04/2023   HGBA1C 5.6 (A) 04/13/2021   HGBA1C 5.9 (H) 07/25/2020     Lab Results  Component Value Date   ALBUMIN 3.3 (L) 03/03/2023   ALBUMIN 4.4 04/13/2021   ALBUMIN 4.1 08/21/2020    No results found for: "MG" No results found for: "VD25OH"  No results found for: "PREALBUMIN"    Latest Ref Rng & Units 03/03/2023    8:49 AM  09/13/2022   11:06 AM 07/28/2020    3:34 AM  CBC EXTENDED  WBC 4.0 - 10.5 K/uL 6.1  7.0  5.0   RBC 4.22 - 5.81 MIL/uL 6.05  6.42  6.37   Hemoglobin 13.0 - 17.0 g/dL 19.1  47.8  29.5   HCT 39.0 - 52.0 % 46.2  49.7  48.0   Platelets 150 - 400 K/uL 217  212  240   NEUT# 1.7 - 7.7 K/uL 4.2     Lymph# 0.7 - 4.0 K/uL 1.2        There is no height or weight on file to calculate BMI.  Orders:  No orders of the defined types were placed in this encounter.  Meds ordered this encounter  Medications   colchicine 0.6 MG tablet    Sig: Take 1 tablet (0.6 mg total) by mouth daily.    Dispense:  30 tablet    Refill:  2     Procedures: No procedures performed  Clinical Data: No additional findings.  ROS:  All other systems negative, except as noted in the HPI. Review of Systems  Objective: Vital Signs: There were no vitals taken for this visit.  Specialty Comments:  No  specialty comments available.  PMFS History: Patient Active Problem List   Diagnosis Date Noted   Encounter for immunization 03/21/2023   Chronic cough 03/21/2023   Acute exacerbation of CHF (congestive heart failure) (HCC) 03/03/2023   Influenza vaccine refused 08/21/2020   Essential hypertension 08/21/2020   Alcohol use disorder, moderate, dependence (HCC) 08/21/2020   Prediabetes 08/21/2020   Stage 3a chronic kidney disease (HCC) 08/21/2020   Hypertrophic cardiomyopathy (HCC) 08/04/2020   Coronary artery disease of native artery of native heart with stable angina pectoris (HCC) 08/04/2020   Renal insufficiency 08/04/2020   Chronic combined systolic and diastolic heart failure (HCC) 07/31/2020   Alcohol use disorder, mild, abuse 07/31/2020   Tobacco abuse 07/31/2020   Hyperlipidemia 07/31/2020   Acute on chronic combined systolic and diastolic CHF (congestive heart failure) (HCC) 07/26/2020   Dyspnea on exertion 07/25/2020   Orthopnea 07/25/2020   Hypertensive urgency 07/25/2020   Past Medical History:   Diagnosis Date   CAD (coronary artery disease)    CHF (congestive heart failure) (HCC)    Hypertension    Prediabetes     No family history on file.  Past Surgical History:  Procedure Laterality Date   RIGHT/LEFT HEART CATH AND CORONARY ANGIOGRAPHY N/A 07/27/2020   Procedure: RIGHT/LEFT HEART CATH AND CORONARY ANGIOGRAPHY;  Surgeon: Yvonne Kendall, MD;  Location: [facility] INVASIVE [facility] LAB;  Service: Cardiovascular;  Laterality: N/A;   Social History   Occupational History   Not on file  Tobacco Use   Smoking status: Every Day    Current packs/day: 0.00    Types: Cigarettes    Start date: 07/24/1980    Last attempt to quit: 07/24/2020    Years since quitting: 3.0   Smokeless tobacco: Never   Tobacco comments:    willing to stop.   Vaping Use   Vaping status: Never Used  Substance and Sexual Activity   Alcohol use: Yes    Alcohol/week: 4.0 standard drinks of alcohol    Types: 4 Cans of beer per week    Comment: 1 40oz can a day, everyday.   Drug use: Not Currently   Sexual activity: Not on file

## 2023-08-30 ENCOUNTER — Telehealth: Payer: Self-pay

## 2023-08-30 LAB — URIC ACID: Uric Acid, Serum: 9.2 mg/dL — ABNORMAL HIGH (ref 4.0–8.0)

## 2023-08-30 MED ORDER — ALLOPURINOL 100 MG PO TABS: 100.0000 mg | ORAL_TABLET | Freq: Two times a day (BID) | ORAL | 3 refills | Status: AC

## 2023-08-30 NOTE — Progress Notes (Signed)
 Will you call, has gout. Will send the allopurinol in Needs to f/u in 4 weeks for labs

## 2023-08-30 NOTE — Telephone Encounter (Signed)
-----   Message from Adonis Huguenin sent at 08/30/2023  9:55 AM EDT ----- Will you call, has gout. Will send the allopurinol in Needs to f/u in 4 weeks for labs

## 2023-08-30 NOTE — Telephone Encounter (Signed)
 Called pt and advised of lab result and Allopurinol called to pharmacy. Made appt for follow up in 4 weeks and will call with any questions.

## 2023-08-30 NOTE — Addendum Note (Signed)
 Addended by: Adonis Huguenin on: 08/30/2023 09:55 AM   Modules accepted: Orders

## 2023-09-27 ENCOUNTER — Ambulatory Visit: Admitting: Family

## 2023-09-29 ENCOUNTER — Ambulatory Visit: Admitting: Family

## 2023-11-09 ENCOUNTER — Other Ambulatory Visit: Payer: Self-pay | Admitting: Student

## 2023-11-09 DIAGNOSIS — I5042 Chronic combined systolic (congestive) and diastolic (congestive) heart failure: Secondary | ICD-10-CM

## 2023-11-10 NOTE — Telephone Encounter (Signed)
 Patient last seen 05/05/2023, I called the patient to schedule a follow up appointment. Unable to reach the patient, lvm for patient to give us  a call back.

## 2023-11-20 ENCOUNTER — Other Ambulatory Visit: Payer: Self-pay | Admitting: Student

## 2023-11-20 DIAGNOSIS — I5042 Chronic combined systolic (congestive) and diastolic (congestive) heart failure: Secondary | ICD-10-CM

## 2023-11-24 ENCOUNTER — Encounter: Payer: Self-pay | Admitting: Student

## 2023-11-24 ENCOUNTER — Encounter: Admitting: Student

## 2023-12-04 ENCOUNTER — Encounter: Payer: Self-pay | Admitting: *Deleted

## 2023-12-06 ENCOUNTER — Other Ambulatory Visit: Payer: Self-pay | Admitting: Student

## 2023-12-06 DIAGNOSIS — I10 Essential (primary) hypertension: Secondary | ICD-10-CM

## 2023-12-06 DIAGNOSIS — I25118 Atherosclerotic heart disease of native coronary artery with other forms of angina pectoris: Secondary | ICD-10-CM

## 2023-12-18 ENCOUNTER — Ambulatory Visit (INDEPENDENT_AMBULATORY_CARE_PROVIDER_SITE_OTHER): Admitting: Student

## 2023-12-18 ENCOUNTER — Other Ambulatory Visit (HOSPITAL_COMMUNITY): Payer: Self-pay

## 2023-12-18 ENCOUNTER — Other Ambulatory Visit: Payer: Self-pay

## 2023-12-18 ENCOUNTER — Encounter: Payer: Self-pay | Admitting: Student

## 2023-12-18 VITALS — BP 135/84 | HR 86 | Temp 97.8°F | Ht 70.0 in | Wt 247.6 lb

## 2023-12-18 DIAGNOSIS — I25118 Atherosclerotic heart disease of native coronary artery with other forms of angina pectoris: Secondary | ICD-10-CM

## 2023-12-18 DIAGNOSIS — F17210 Nicotine dependence, cigarettes, uncomplicated: Secondary | ICD-10-CM

## 2023-12-18 DIAGNOSIS — I5042 Chronic combined systolic (congestive) and diastolic (congestive) heart failure: Secondary | ICD-10-CM

## 2023-12-18 DIAGNOSIS — R0609 Other forms of dyspnea: Secondary | ICD-10-CM

## 2023-12-18 DIAGNOSIS — I10 Essential (primary) hypertension: Secondary | ICD-10-CM

## 2023-12-18 DIAGNOSIS — R053 Chronic cough: Secondary | ICD-10-CM

## 2023-12-18 DIAGNOSIS — I110 Hypertensive heart disease with heart failure: Secondary | ICD-10-CM | POA: Diagnosis present

## 2023-12-18 DIAGNOSIS — Z720 Tobacco use: Secondary | ICD-10-CM

## 2023-12-18 MED ORDER — SPIRONOLACTONE 25 MG PO TABS: 25.0000 mg | ORAL_TABLET | Freq: Every day | ORAL | 3 refills | Status: AC

## 2023-12-18 MED ORDER — LOSARTAN POTASSIUM 25 MG PO TABS: 25.0000 mg | ORAL_TABLET | Freq: Every day | ORAL | 3 refills | Status: AC

## 2023-12-18 MED ORDER — ALBUTEROL SULFATE HFA 108 (90 BASE) MCG/ACT IN AERS: 2.0000 | INHALATION_SPRAY | Freq: Four times a day (QID) | RESPIRATORY_TRACT | 2 refills | Status: DC | PRN

## 2023-12-18 MED ORDER — NICOTINE 14 MG/24HR TD PT24: 14.0000 mg | MEDICATED_PATCH | TRANSDERMAL | 0 refills | Status: AC

## 2023-12-18 MED ORDER — CARVEDILOL 12.5 MG PO TABS: 12.5000 mg | ORAL_TABLET | Freq: Two times a day (BID) | ORAL | 3 refills | Status: DC

## 2023-12-18 MED ORDER — ATORVASTATIN CALCIUM 80 MG PO TABS: 80.0000 mg | ORAL_TABLET | Freq: Every day | ORAL | 3 refills | Status: AC

## 2023-12-18 MED ORDER — FUROSEMIDE 40 MG PO TABS: 40.0000 mg | ORAL_TABLET | ORAL | 1 refills | Status: DC

## 2023-12-18 NOTE — Progress Notes (Signed)
 Internal Medicine Clinic Attending  Case discussed with the resident at the time of the visit.  We reviewed the resident's history and exam and pertinent patient test results.  I agree with the assessment, diagnosis, and plan of care documented in the resident's note.   I agree with starting ARB since Entresto  is unfortunately not covered for him.  We're working hard on adherence, so will arrange close follow up to keep working on this.

## 2023-12-18 NOTE — Progress Notes (Signed)
 CC: HTN/HF follow up  HPI:  [name] is a 63 y.o. male living with a history stated below and presents today for hypertension and HFrEF follow up . Please see problem based assessment and plan for additional details.  Past Medical History:  Diagnosis Date   CAD (coronary artery disease)    CHF (congestive heart failure) (HCC)    Hypertension    Prediabetes     Current Outpatient Medications on File Prior to Visit  Medication Sig Dispense Refill   allopurinol  (ZYLOPRIM ) 100 MG tablet Take 1 tablet (100 mg total) by mouth 2 (two) times daily. 60 tablet 3   aspirin  EC 81 MG tablet Take 1 tablet (81 mg total) by mouth daily. Swallow whole. 30 tablet 12   colchicine  0.6 MG tablet Take 1 tablet (0.6 mg total) by mouth daily. 30 tablet 2   empagliflozin  (JARDIANCE ) 10 MG TABS tablet Take 1 tablet (10 mg total) by mouth daily before breakfast. 30 tablet 1   nitroGLYCERIN  (NITROSTAT ) 0.4 MG SL tablet Place 1 tablet (0.4 mg total) under the tongue every 5 (five) minutes x 3 doses as needed for chest pain. 25 tablet 6   No current facility-administered medications on file prior to visit.    History reviewed. No pertinent family history.  Social History   Socioeconomic History   Marital status: Single    Spouse name: Not on file   Number of children: Not on file   Years of education: Not on file   Highest education level: Not on file  Occupational History   Not on file  Tobacco Use   Smoking status: Every Day    Current packs/day: 0.00    Types: Cigarettes    Start date: 07/24/1980    Last attempt to quit: 07/24/2020    Years since quitting: 3.4   Smokeless tobacco: Never   Tobacco comments:    willing to stop.   Vaping Use   Vaping status: Never Used  Substance and Sexual Activity   Alcohol use: Yes    Alcohol/week: 4.0 standard drinks of alcohol    Types: 4 Cans of beer per week    Comment: 1 40oz can a day, everyday.   Drug use: Not Currently   Sexual  activity: Not on file  Other Topics Concern   Not on file  Social History Narrative   ** Merged History Encounter **       Social Drivers of Health   Financial Resource Strain: High Risk (07/27/2020)   Overall Financial Resource Strain (CARDIA)    Difficulty of Paying Living Expenses: Hard  Food Insecurity: No Food Insecurity (03/03/2023)   Hunger Vital Sign    Worried About Running Out of Food in the Last Year: Never true    Ran Out of Food in the Last Year: Never true  Transportation Needs: No Transportation Needs (03/03/2023)   PRAPARE - Administrator, Civil Service (Medical): No    Lack of Transportation (Non-Medical): No  Physical Activity: Insufficiently Active (07/27/2020)   Exercise Vital Sign    Days of Exercise per Week: 4 days    Minutes of Exercise per Session: 30 min  Stress: Not on file  Social Connections: Not on file  Intimate Partner Violence: Not At Risk (03/03/2023)   Humiliation, Afraid, Rape, and Kick questionnaire    Fear of Current or Ex-Partner: No    Emotionally Abused: No    Physically Abused: No    Sexually Abused:  No    Review of Systems: ROS negative except for what is noted on the assessment and plan.  Vitals:   12/18/23 1001 12/18/23 1006  BP: (!) 161/93 135/84  Pulse: 89 86  Temp: 97.8 F (36.6 C)   TempSrc: Oral   SpO2: 96%   Weight: 247 lb 9.6 oz (112.3 kg)   Height: 5' 10 (1.778 m)     Physical Exam: Constitutional: well-appearing male in no acute distress HENT: normocephalic atraumatic, mucous membranes moist Eyes: conjunctiva non-erythematous Neck: supple, no JVD appreciated Cardiovascular: regular rate and rhythm, no m/r/g Pulmonary/Chest: normal work of breathing on room air, lungs clear to auscultation bilaterally   Assessment & Plan:   Chronic combined systolic and diastolic heart failure (HCC) Last echocardiogram showed an EF of 30 to 35%, no significant amount of ventricular hypertrophy.  His prescribed  medications are currently Jardiance  10 mg, Coreg  12.5 twice a day, Entresto  49-51 twice a day, and Lasix  40 mg weekly.   Fortunately, patient did not bring in his medications today.  He is only taking spironolactone  25 mg, carvedilol  12.5 mg twice a day, and Lasix  40 mg once a week.  He was unable to afford his Entresto  unfortunately, same with his Jardiance . His current weight is 247 pounds, compared to 225 pounds back in March at his podiatry appointment.  He does not appear volume overloaded on exam, but does endorse that his diet has been controlled.  I counseled him on the importance of nutrition, especially with his heart failure.  Patient to our pharmacist, and unfortunately Entresto  is not covered through his insurance, so we will start him on losartan 25 mg (he has not taken Entresto  in a few months).  I have refilled his other medications as well.  Plan: - Continue spironolactone  25 mg - Continue carvedilol  12.5 mg - Continue Lasix  40 mg once a week - Stopped Entresto , and start losartan 25 mg - Unfortunately Jardiance  and Farxiga  are very expensive and not covered by his insurance  Essential hypertension Initial blood pressure in the clinic is 161/93, repeat blood pressure is 135/84.  I have added on losartan to his regimen for his blood pressure, as well as his heart failure with reduced ejection fraction.  Plan: - Losartan 25 mg initiation - Check BMP today - Continue spironolactone  and Coreg   Tobacco abuse Patient still is currently smoking, about 10 cigarettes a day.  Was prescribed Chantix  at his last clinic appointment in October 2024, however he states that it made him feel woozy.  He is interested in quitting, and is interested in nicotine  patches.  I discussed with him at his next visit he should set a goal to go from 10 cigarettes a day to 5 cigarettes a day.  He is agreeable.  Plan: - Nicotine  patches sent in  Dyspnea on exertion Likely unofficially diagnosed COPD.   He states he has intermittent episodes of wheezing, that was previously improved with albuterol .  Will refill, and send for formal PFTs for further evaluation.  He is actively still smoking, this is discussed in a separate problem.   Patient discussed with Dr. Machen  Constancia Geeting, M.D. [facility] Internal Medicine, PGY-3 Pager: [phone] Date 12/18/2023 Time 11:38 AM

## 2023-12-18 NOTE — Assessment & Plan Note (Signed)
 Likely unofficially diagnosed COPD.  He states he has intermittent episodes of wheezing, that was previously improved with albuterol .  Will refill, and send for formal PFTs for further evaluation.  He is actively still smoking, this is discussed in a separate problem.

## 2023-12-18 NOTE — Assessment & Plan Note (Signed)
 Patient still is currently smoking, about 10 cigarettes a day.  Was prescribed Chantix  at his last clinic appointment in October 2024, however he states that it made him feel woozy.  He is interested in quitting, and is interested in nicotine  patches.  I discussed with him at his next visit he should set a goal to go from 10 cigarettes a day to 5 cigarettes a day.  He is agreeable.  Plan: - Nicotine  patches sent in

## 2023-12-18 NOTE — Patient Instructions (Addendum)
 Thank you so much for coming to the clinic today!   I will be giving you a call when I have some answers from the pharmacist. [place] also get some lab work today as well. Remember our goal for going to 5 cigarettes  for the next time you're in clinic. I have sent in a refill of your medications.   If you have any questions please feel free to the call the clinic at anytime at [phone]. It was a pleasure seeing you!  Best, Dr. Lovella Hardie

## 2023-12-18 NOTE — Assessment & Plan Note (Signed)
 Initial blood pressure in the clinic is 161/93, repeat blood pressure is 135/84.  I have added on losartan to his regimen for his blood pressure, as well as his heart failure with reduced ejection fraction.  Plan: - Losartan 25 mg initiation - Check BMP today - Continue spironolactone  and Coreg 

## 2023-12-18 NOTE — Assessment & Plan Note (Addendum)
 Last echocardiogram showed an EF of 30 to 35%, no significant amount of ventricular hypertrophy.  His prescribed medications are currently Jardiance  10 mg, Coreg  12.5 twice a day, Entresto  49-51 twice a day, and Lasix  40 mg weekly.   Fortunately, patient did not bring in his medications today.  He is only taking spironolactone  25 mg, carvedilol  12.5 mg twice a day, and Lasix  40 mg once a week.  He was unable to afford his Entresto  unfortunately, same with his Jardiance . His current weight is 247 pounds, compared to 225 pounds back in March at his podiatry appointment.  He does not appear volume overloaded on exam, but does endorse that his diet has been controlled.  I counseled him on the importance of nutrition, especially with his heart failure.  Patient to our pharmacist, and unfortunately Entresto  is not covered through his insurance, so we will start him on losartan 25 mg (he has not taken Entresto  in a few months).  I have refilled his other medications as well.  Plan: - Continue spironolactone  25 mg - Continue carvedilol  12.5 mg - Continue Lasix  40 mg once a week - Stopped Entresto , and start losartan 25 mg - Unfortunately Jardiance  and Farxiga  are very expensive and not covered by his insurance

## 2024-02-06 ENCOUNTER — Other Ambulatory Visit: Payer: Self-pay

## 2024-02-06 DIAGNOSIS — I10 Essential (primary) hypertension: Secondary | ICD-10-CM

## 2024-02-06 DIAGNOSIS — I25118 Atherosclerotic heart disease of native coronary artery with other forms of angina pectoris: Secondary | ICD-10-CM

## 2024-02-06 MED ORDER — CARVEDILOL 12.5 MG PO TABS: 12.5000 mg | ORAL_TABLET | Freq: Two times a day (BID) | ORAL | 3 refills | Status: AC

## 2024-04-01 ENCOUNTER — Other Ambulatory Visit: Payer: Self-pay | Admitting: Student

## 2024-04-01 NOTE — Telephone Encounter (Signed)
 Medication sent to pharmacy

## 2024-04-22 ENCOUNTER — Encounter: Payer: Self-pay | Admitting: Radiology

## 2024-06-10 ENCOUNTER — Other Ambulatory Visit: Payer: Self-pay

## 2024-06-10 DIAGNOSIS — I5042 Chronic combined systolic (congestive) and diastolic (congestive) heart failure: Secondary | ICD-10-CM

## 2024-06-11 MED ORDER — FUROSEMIDE 40 MG PO TABS: 40.0000 mg | ORAL_TABLET | ORAL | 1 refills | Status: AC

## 2024-06-21 ENCOUNTER — Other Ambulatory Visit: Payer: Self-pay | Admitting: Student

## 2024-06-24 NOTE — Telephone Encounter (Signed)
 Medication sent to pharmacy

## 2024-06-28 ENCOUNTER — Ambulatory Visit: Payer: Self-pay | Admitting: Student

## 2024-06-28 NOTE — Progress Notes (Unsigned)
 Chronic combined systolic and diastolic heart failure (HCC) Last echocardiogram showed an EF of 30 to 35%, no significant amount of ventricular hypertrophy.  His prescribed medications are currently Jardiance  10 mg, Coreg  12.5 twice a day, Entresto  49-51 twice a day, and Lasix  40 mg weekly.    Fortunately, patient did bring in his medications today.  He is only taking spironolactone  25 mg, carvedilol  12.5 mg twice a day, and Lasix  40 mg once a week.  He was unable to afford his Entresto  unfortunately, same with his Jardiance . His current weight is 247 pounds, compared to 225 pounds back in March at his podiatry appointment.  He does not appear volume overloaded on exam, but does endorse that his diet has been uncontrolled.  I counseled him on the importance of nutrition, especially with his heart failure.  Discussed with our pharmacist, and unfortunately Entresto  is not covered through his insurance, so we will start him on losartan  25 mg (he has not taken Entresto  in a few months).  I have refilled his other medications as well.   Plan: - Continue spironolactone  25 mg - Continue carvedilol  12.5 mg - Continue Lasix  40 mg once a week - Stopped Entresto , and start losartan  25 mg - Unfortunately Jardiance  and Farxiga  are very expensive and not covered by his insurance   Essential hypertension Initial blood pressure in the clinic is 161/93, repeat blood pressure is 135/84.  I have added on losartan  to his regimen for his blood pressure, as well as his heart failure with reduced ejection fraction.   Plan: - Losartan  25 mg initiation - Check BMP today - Continue spironolactone  and Coreg    Tobacco abuse Patient still is currently smoking, about 10 cigarettes a day.  Was prescribed Chantix  at his last clinic appointment in October 2024, however he states that it made him feel woozy.  He is interested in quitting, and is interested in nicotine  patches.  I discussed with him at his next visit he should  set a goal to go from 10 cigarettes a day to 5 cigarettes a day.  He is agreeable.   Plan: - Nicotine  patches sent in   Dyspnea on exertion Likely unofficially diagnosed COPD.  He states he has intermittent episodes of wheezing, that was previously improved with albuterol .  Will refill, and send for formal PFTs for further evaluation.  He is actively still smoking, this is discussed in a separate problem.      CC: Overdue follow-up after last office visit 12/18/2023  HPI:  Dustin Fowler is a 64 y.o. male with pertinent PMH of hypertension, CHF with hypertrophic cardiomyopathy, CKD stage IIIa, gout, hyperlipidemia, prediabetes, alcohol use disorder, and tobacco use disorder who presents as above. Please see assessment and plan below for further details.  Medications: Current Outpatient Medications  Medication Instructions   albuterol  (VENTOLIN  HFA) 108 (90 Base) MCG/ACT inhaler 2 puffs, Inhalation, Every 6 hours PRN   allopurinol  (ZYLOPRIM ) 100 mg, Oral, 2 times daily   aspirin  EC 81 mg, Oral, Daily, Swallow whole.   atorvastatin  (LIPITOR ) 80 mg, Oral, Daily   carvedilol  (COREG ) 12.5 mg, Oral, 2 times daily with meals   colchicine  0.6 mg, Oral, Daily   empagliflozin  (JARDIANCE ) 10 mg, Oral, Daily before breakfast   furosemide  (LASIX ) 40 mg, Oral, Weekly   losartan  (COZAAR ) 25 mg, Oral, Daily   nicotine  (NICODERM CQ  - DOSED IN MG/24 HOURS) 14 mg, Transdermal, Every 24 hours   nitroGLYCERIN  (NITROSTAT ) 0.4 mg, Sublingual, Every 5 min x3  PRN   spironolactone  (ALDACTONE ) 25 mg, Oral, Daily     Review of Systems:   Pertinent items noted in HPI and/or A&P.  Physical Exam:  There were no vitals filed for this visit.  Constitutional:***. In no acute distress. HEENT: Normocephalic, atraumatic, Sclera non-icteric, PERRL, EOM intact Cardio:Regular rate and rhythm. 2+ bilateral {PulseLoc:28294} pulses. Pulm:Clear to auscultation bilaterally. Normal work of breathing on room  air. Abdomen: Soft, non-tender, non-distended, positive bowel sounds. FDX:Wzhjupcz for extremity edema. Skin:Warm and dry. Neuro:Alert and oriented x3. No focal deficit noted. Psych:Pleasant mood and affect.   Assessment & Plan:   Assessment & Plan   No orders of the defined types were placed in this encounter.    No follow-ups on file.   Patient {GC/GE:[id number]::discussed with,seen with} {JGIMTSattending2025/2026:32954}  Fairy Pool, DO [facility] Internal [facility] Resident PGY-3 Clinic Phone: [phone] Please contact the on call pager at [phone] for any urgent or emergent needs.
